# Patient Record
Sex: Female | Born: 1985 | Race: White | Hispanic: No | Marital: Single | Smoking: Never smoker
Health system: Southern US, Community
[De-identification: ages and names within clinical notes are randomized; demographics above are authoritative.]

## PROBLEM LIST (undated history)

## (undated) DIAGNOSIS — F419 Anxiety disorder, unspecified: Secondary | ICD-10-CM

## (undated) HISTORY — DX: Anxiety disorder, unspecified: F41.9

---
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
## 2006-01-02 ENCOUNTER — Inpatient Hospital Stay (HOSPITAL_COMMUNITY): Admission: AD | Admit: 2006-01-02 | Discharge: 2006-01-03 | Payer: Self-pay | Admitting: Obstetrics

## 2006-01-03 ENCOUNTER — Inpatient Hospital Stay (HOSPITAL_COMMUNITY): Admission: AD | Admit: 2006-01-03 | Discharge: 2006-01-05 | Payer: Self-pay | Admitting: Obstetrics & Gynecology

## 2006-07-24 ENCOUNTER — Emergency Department (HOSPITAL_COMMUNITY): Admission: EM | Admit: 2006-07-24 | Discharge: 2006-07-24 | Payer: Self-pay | Admitting: Emergency Medicine

## 2006-07-24 ENCOUNTER — Emergency Department (HOSPITAL_COMMUNITY): Admission: EM | Admit: 2006-07-24 | Discharge: 2006-07-24 | Payer: Self-pay | Admitting: Family Medicine

## 2006-10-10 ENCOUNTER — Emergency Department (HOSPITAL_COMMUNITY): Admission: EM | Admit: 2006-10-10 | Discharge: 2006-10-10 | Payer: Self-pay | Admitting: Emergency Medicine

## 2007-10-07 ENCOUNTER — Encounter: Admission: RE | Admit: 2007-10-07 | Discharge: 2007-10-07 | Payer: Self-pay | Admitting: Obstetrics & Gynecology

## 2007-10-22 ENCOUNTER — Ambulatory Visit (HOSPITAL_COMMUNITY): Admission: RE | Admit: 2007-10-22 | Discharge: 2007-10-22 | Payer: Self-pay | Admitting: Obstetrics & Gynecology

## 2007-10-25 IMAGING — US US OB COMP +14 WK
1 series · 13 of 28 positions shown · non-contrast
Comparison: none

CLINICAL DATA: Hypertension; assigned gestational age is 35 weeks 6 days.

[Series 1: us ob comp +14 wk · 0.31mm/px · 13 of 31 slices shown]
[im 2/31]
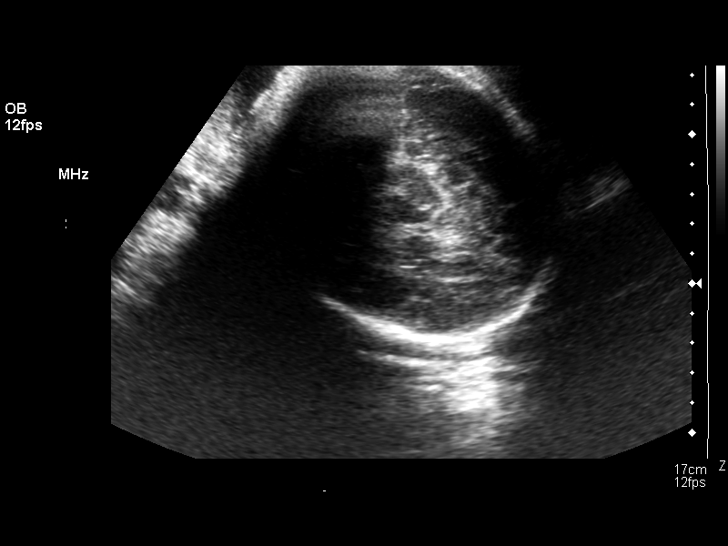
[im 4/31]
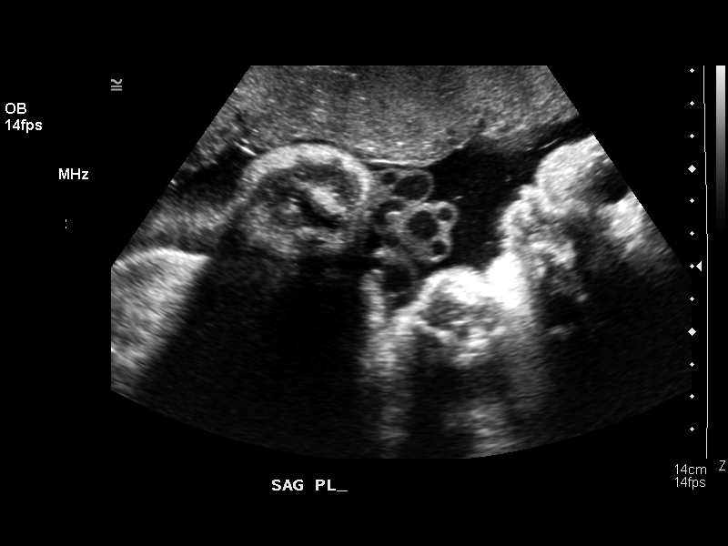
[im 6/31]
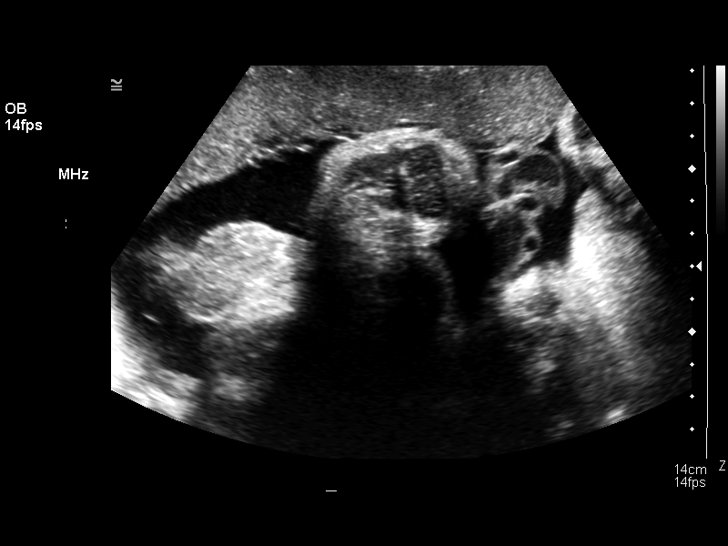
[im 8/31]
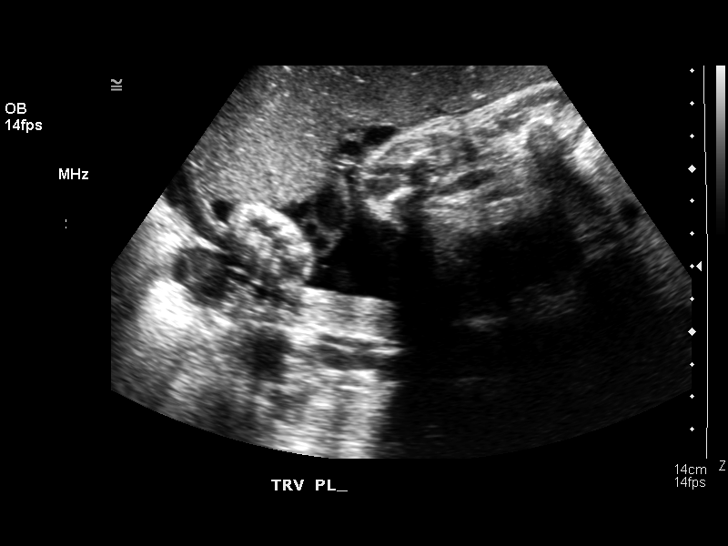
[im 11/31]
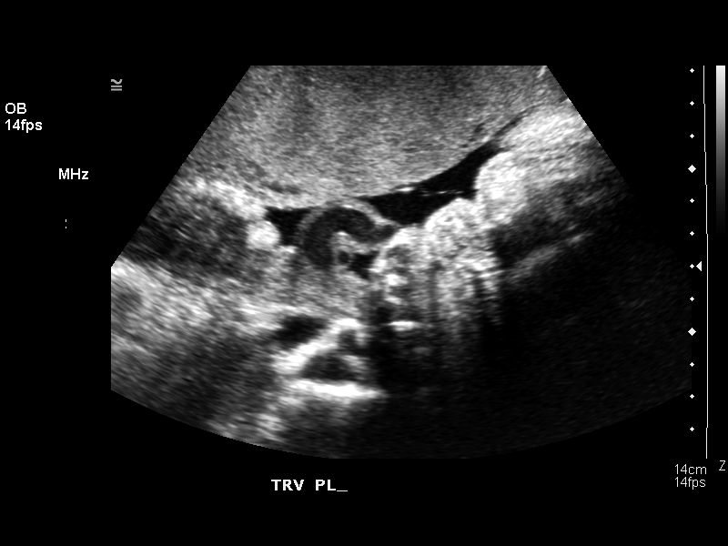
[im 13/31]
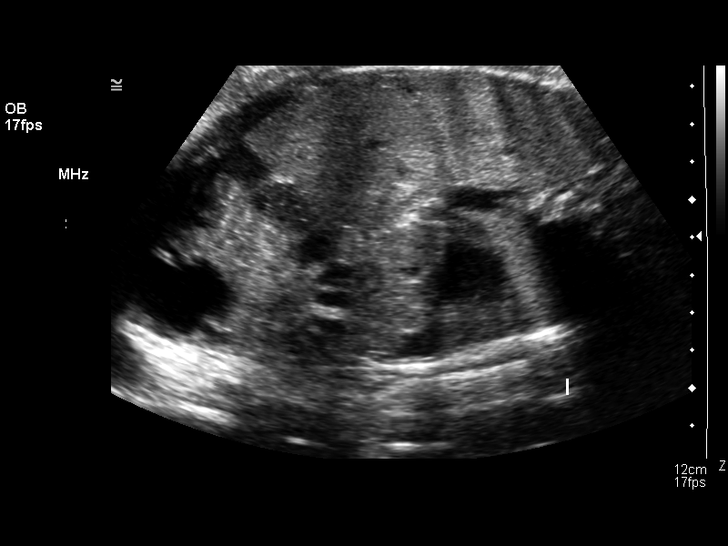
[im 16/31]
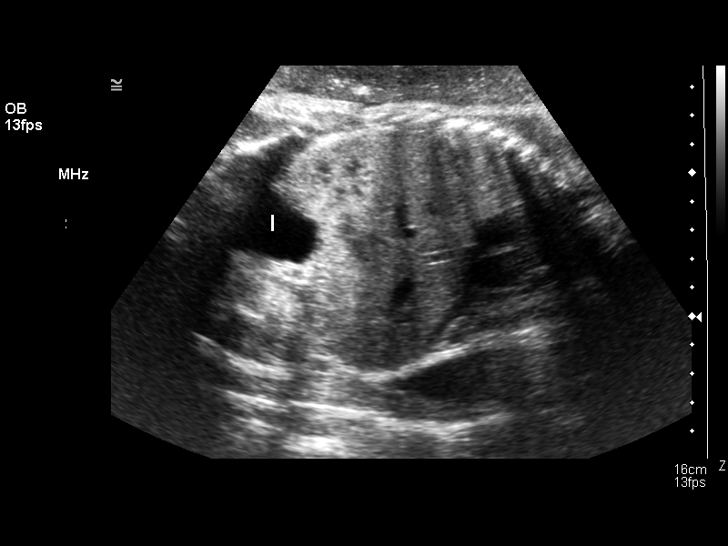
[im 18/31]
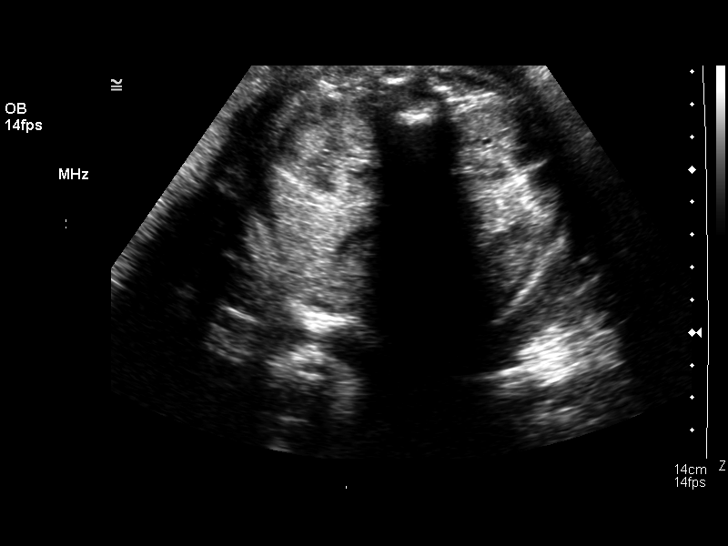
[im 21/31]
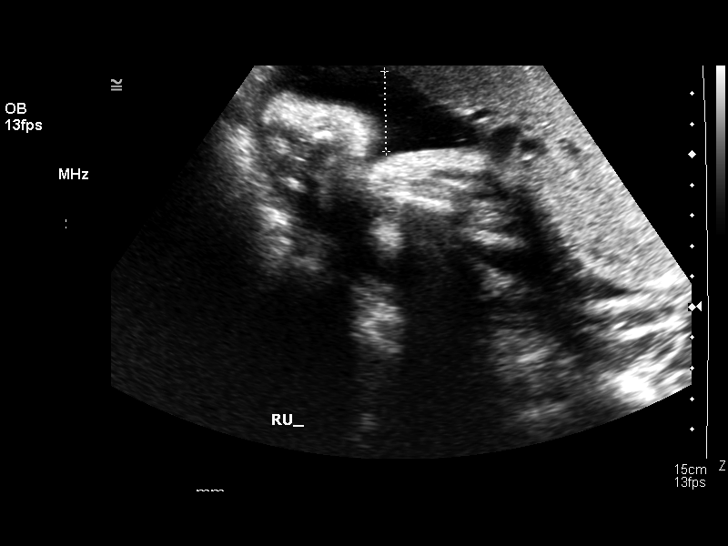
[im 23/31]
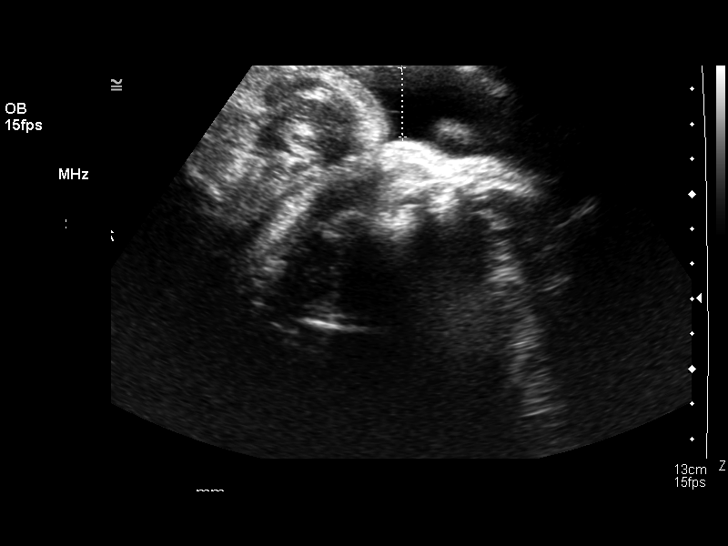
[im 25/31]
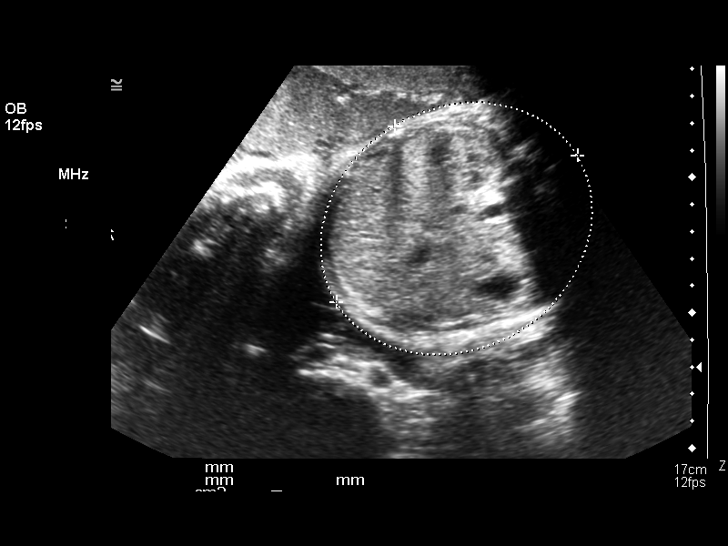
[im 27/31]
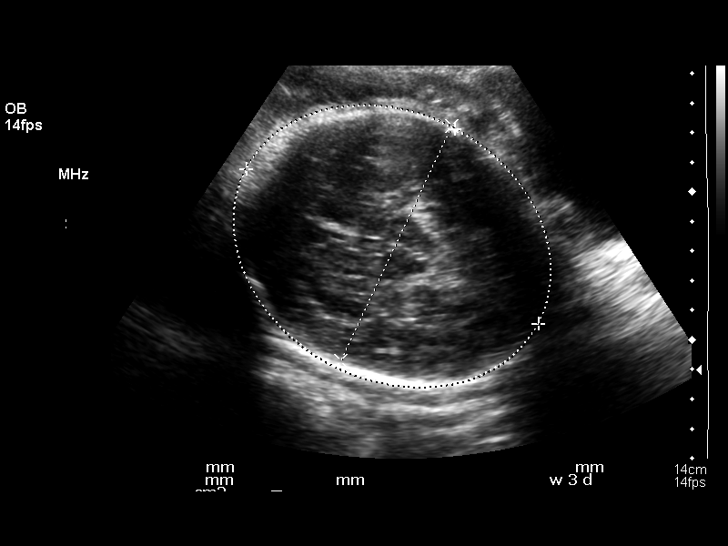
[im 29/31]
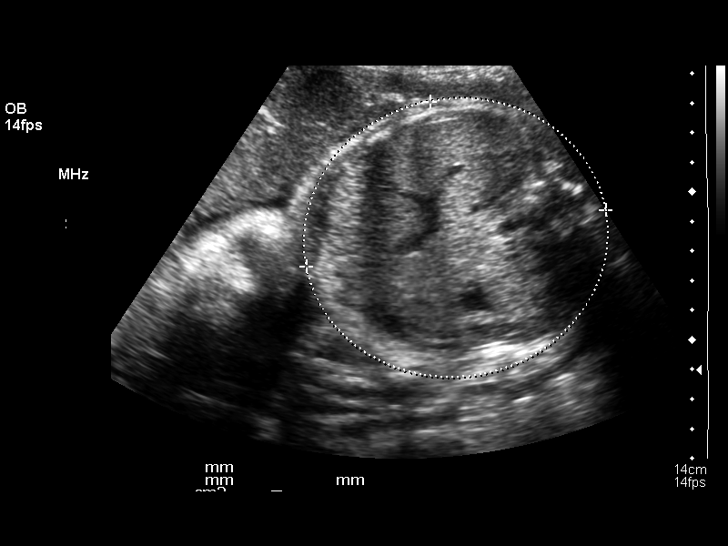

[13 of 28 positions shown; findings below may reference images not displayed]

OBSTETRICAL ULTRASOUND:
 Number of Fetuses: 1
 Heart Rate:  147
 Movement:  Yes
 Breathing:  No  
 Presentation:  Cephalic
 Placental Location:  Anterior
 Grade:  I
 Previa:  No
 Amniotic Fluid (Subjective):  Normal
 Amniotic Fluid (Objective):   10.9 cm AFI (5th -95th%ile = 7.7 ? 24.9 cm for 36 wks)

 FETAL BIOMETRY
 BPD:   8.7 cm  35 w 1 d
 HC:   31.7 cm   35 w 5 d
 AC:   30.8 cm   34 w 5 d
 FL:   6.8 cm   34 w 6 d

 MEAN GA:  35 w 1 d  US EDC:  01/16/06

 EFW: 1572 g (H) 25th ? 50th%ile (0147 ? 9499 g) For 36 wks 

 FETAL ANATOMY
 Lateral Ventricles:    Visualized 
 Thalami/CSP:      Visualized 
 Posterior Fossa:  Not visualized 
 Nuchal Region:    N/A
 Spine:      Not visualized 
 4 Chamber Heart on Left:      Visualized 
 Stomach on Left:      Not visualized 
 3 Vessel Cord:    Visualized 
 Cord Insertion site:    Not visualized   
 Kidneys:  Visualized 
 Bladder:  Visualized 
 Extremities:      Not visualized 

 Evaluation limited by: Advanced gestational age

 MATERNAL UTERINE AND ADNEXAL FINDINGS
 Cervix: Not evaluated > 34 wks
IMPRESSION: There is a single living intrauterine gestation in cephalic presentation.  The mean gestational age by today's ultrasound is 35 weeks 1 day which is concordant with the assigned gestational age.  The estimated fetal weight is at the 25th ? 50th percentile for a 36 week gestation.  The fetal indices are concordant.  The amniotic fluid volume is within normal limits with a total AFI of 10.9 cm.

## 2008-03-12 ENCOUNTER — Inpatient Hospital Stay (HOSPITAL_COMMUNITY): Admission: AD | Admit: 2008-03-12 | Discharge: 2008-03-14 | Payer: Self-pay | Admitting: Obstetrics & Gynecology

## 2009-09-02 IMAGING — US US OB COMP +14 WK
2 series · 14 of 28 positions shown · non-contrast
Comparison: none

OBSTETRICAL ULTRASOUND:

 This ultrasound exam was performed in the [HOSPITAL] Ultrasound Department.  The OB US report was generated in the AS system, and faxed to the ordering physician.  This report is also available in [REDACTED] PACS.

[Series 1: us ob comp +14 wk · 12 of 90 slices shown (1 of 2)]
[im 4/90]
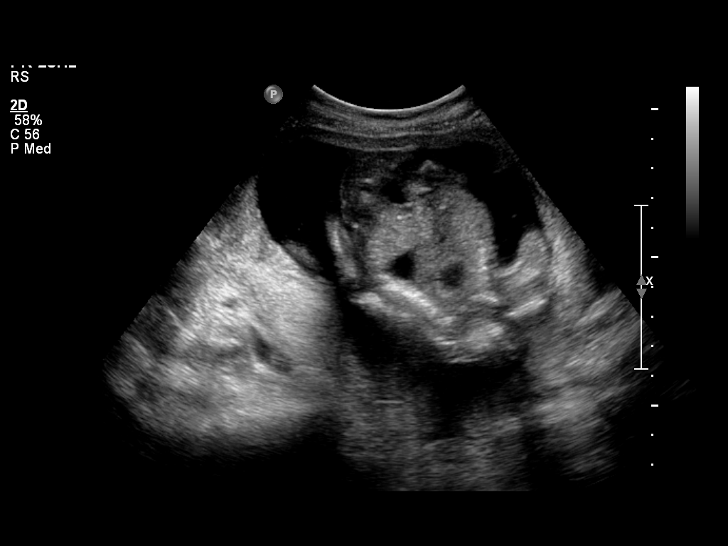
[im 12/90]
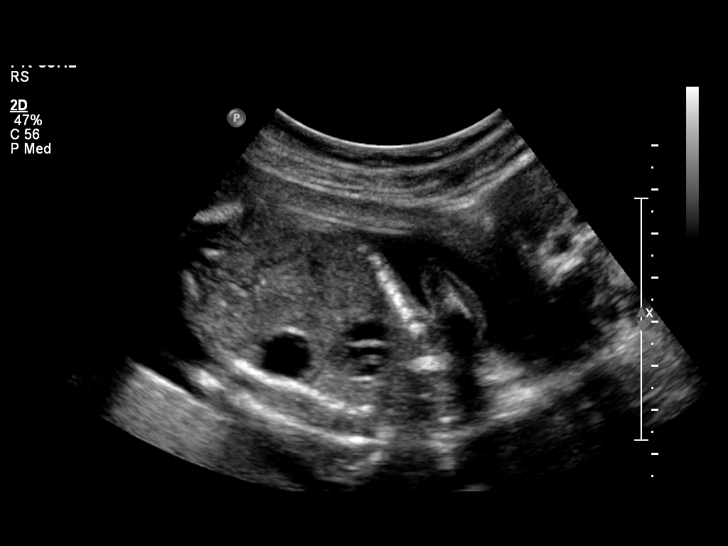
[im 19/90]
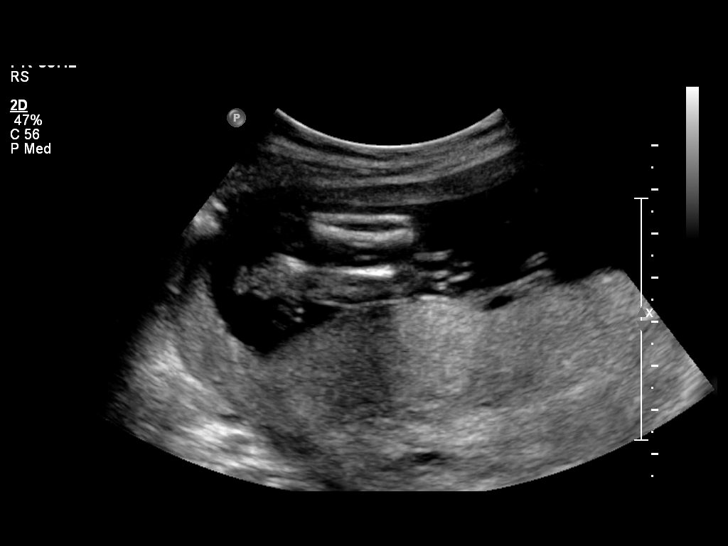
[im 26/90]
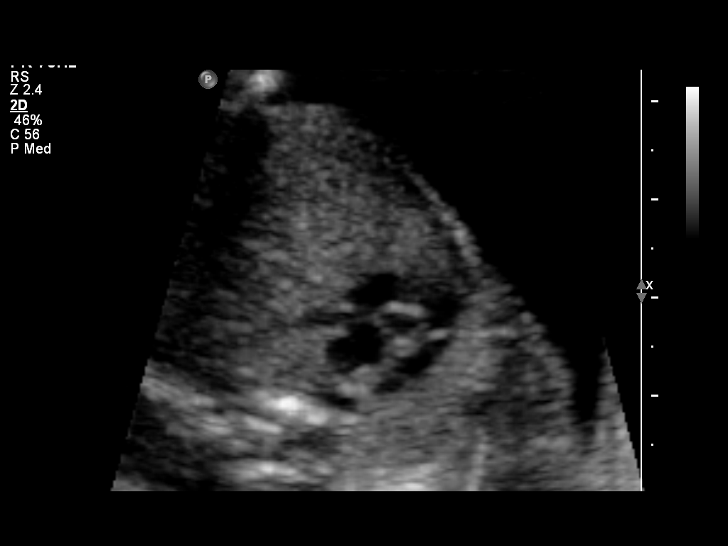
[im 34/90]
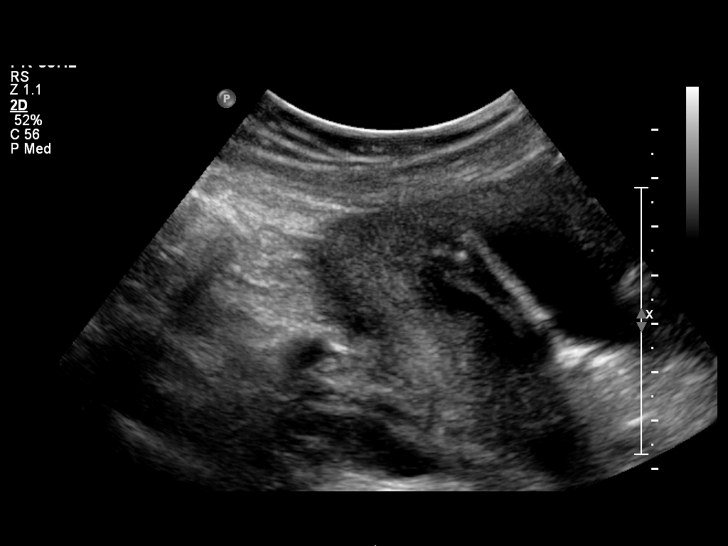
[im 41/90]
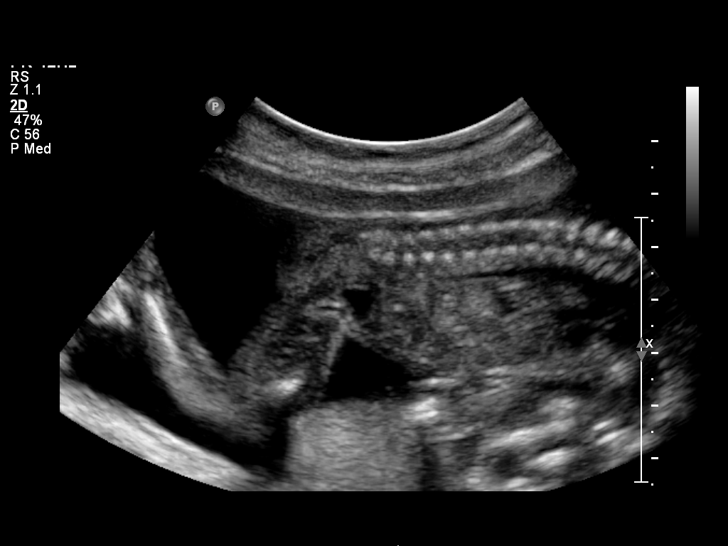
[im 49/90]
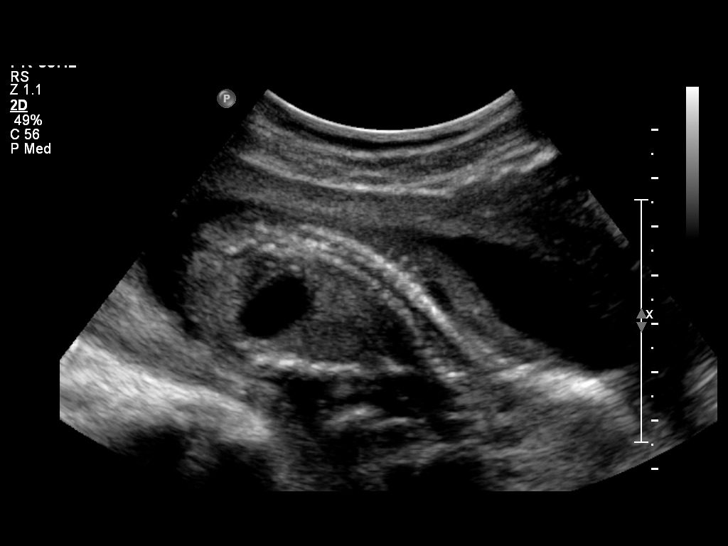
[im 56/90]
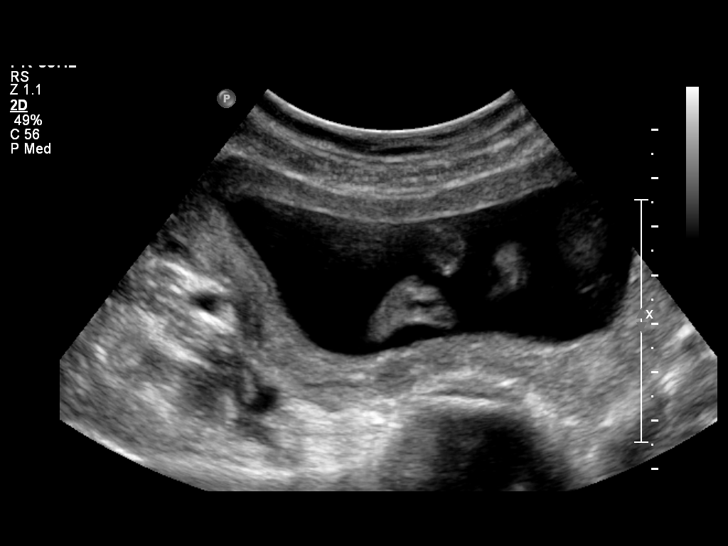
[im 64/90]
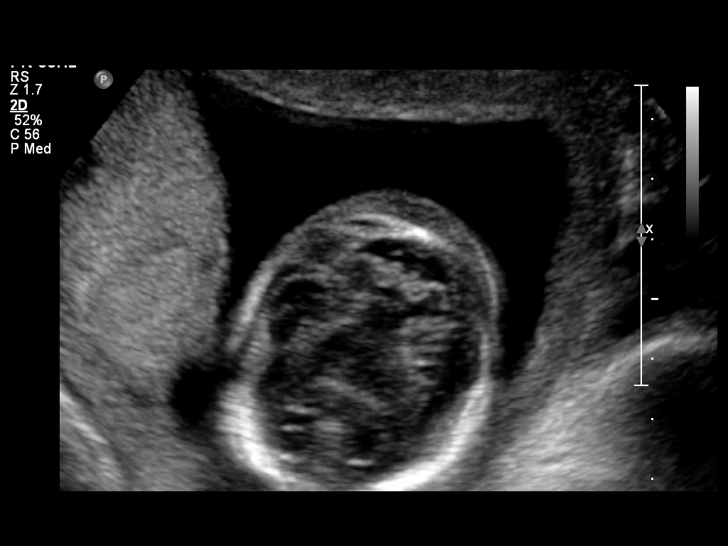
[im 71/90]
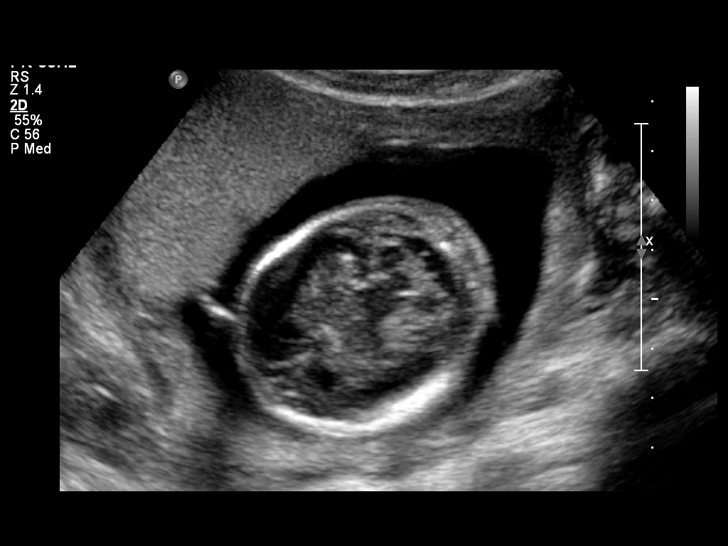
[im 78/90]
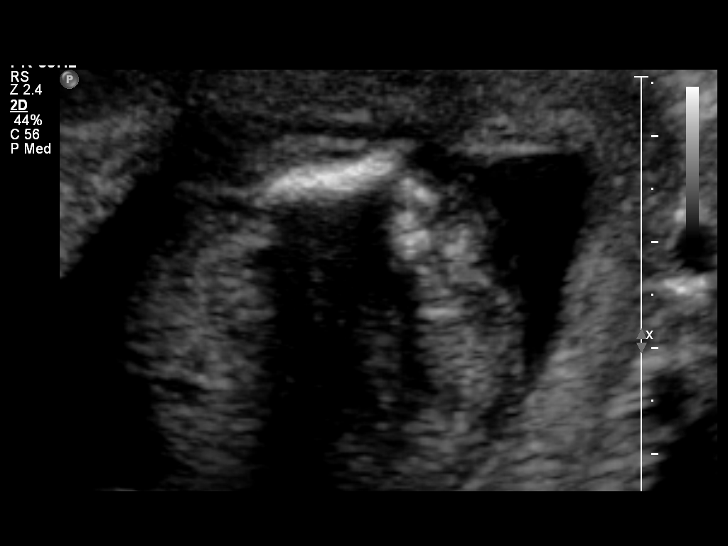
[im 86/90]
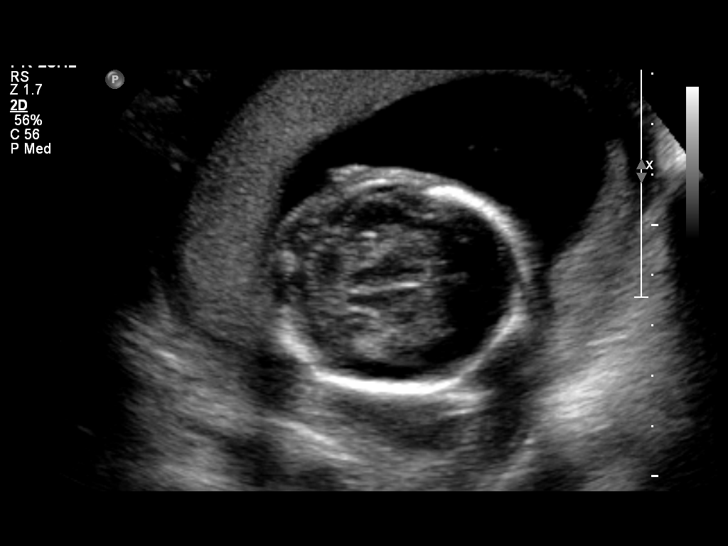

[Series 1: us ob comp +14 wk · 2 of 10 slices shown (2 of 2)]
[im 1/10]
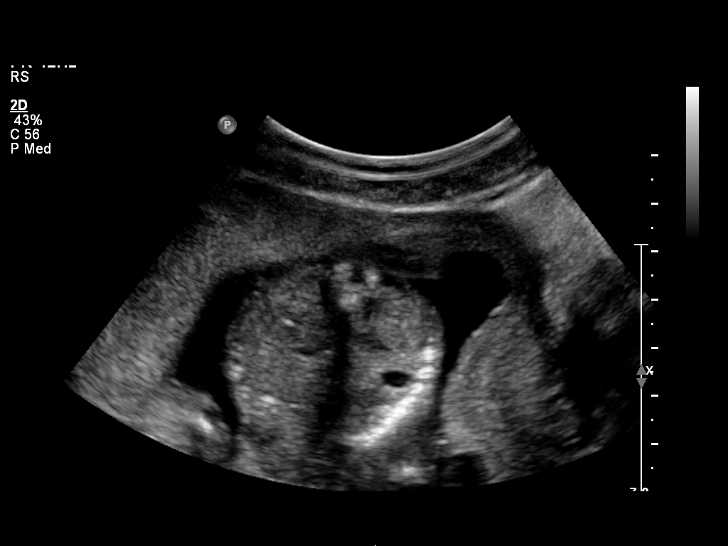
[im 10/10]
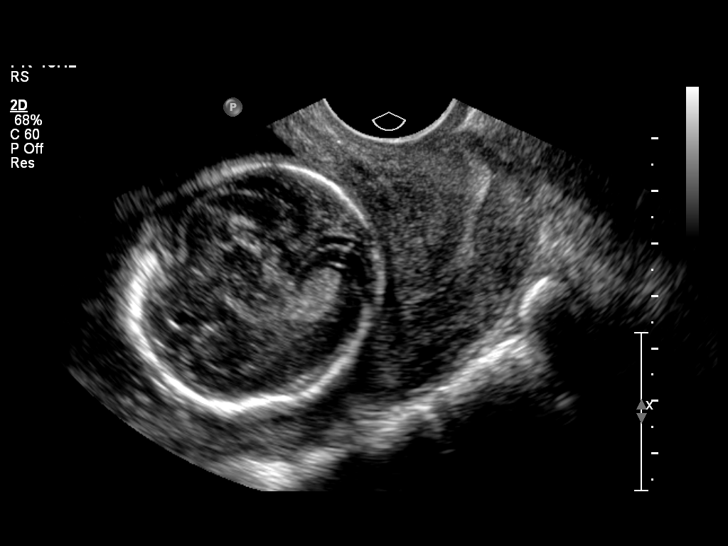

[14 of 28 positions shown; findings below may reference images not displayed]

IMPRESSION: See AS Obstetric US report.

## 2011-01-18 NOTE — H&P (Signed)
NAMEJONIECE, SMOTHERMAN           ACCOUNT NO.:  [id number]   MEDICAL RECORD NO.:  [id number]          PATIENT TYPE:  INP   LOCATION:  9160                          FACILITY:  [facility]   PHYSICIAN:  Roseanna Rainbow, M.D.DATE OF BIRTH:  May 01, 1986   DATE OF ADMISSION:  01/03/2006  DATE OF DISCHARGE:                                HISTORY & PHYSICAL   CHIEF COMPLAINT:  The patient is an 25 year old, gravida 1, para 0 with an  estimated date of confinement of Jan 11, 2006, with an intrauterine  pregnancy at 38+ weeks for induction of labor.   HISTORY OF PRESENT ILLNESS:  Please see the above.  The patient has had  elevated blood pressures throughout her antepartum course, so there is a  suspicion for possible chronic hypertension, possible superimposed pregnancy-  induced hypertension.  Baseline PIH panel in February was normal as well as  a baseline 24 hour urine for protein was normal as well.   ALLERGIES:  No known drug allergies.   MEDICATIONS:  Prenatal vitamins.   OB RISK FACTORS:  Please see the above.  She is an adolescent.   PRENATAL LABORATORY:  One hour Glucola 149 with a normal 3 hour GTT.  Blood  type O positive, antibody screen negative.  Hemoglobin 11.9, hematocrit  36.7, platelets 271,000.  Hepatitis B surface antigen negative, HIV  declined, RPR nonreactive, rubella immune.  An ultrasound on April 13 at 35  weeks 1 day, the estimated fetal weight percentile was 25th to the 50th  percentile.  The amniotic fluid was normal.   PAST OB/GYN HISTORY:  Noncontributory.   PAST MEDICAL HISTORY:  1.  Iron deficiency anemia.  2.  Questionable syncopal episodes of undetermined etiology.   PAST SURGICAL HISTORY:  No previous surgery.   SOCIAL HISTORY:  She is a Engineer, petroleum.  She is single, does not give  any significant history of alcohol use, no significant smoking history,  denies illicit drug use.   FAMILY HISTORY:  No major illnesses known.   PHYSICAL  EXAMINATION:  VITAL SIGNS:  Temperature 98.1, respiratory rate 20,  blood pressure 151/77, fetal heart tracing reassuring, tocodynamometer  monitor no uterine contractions.  STERILE VAGINAL EXAM:  Cervix is 3 and 100%.   ASSESSMENT:  Primigravida with an intrauterine pregnancy at term, rule out  chronic hypertension with superimposed pregnancy-induced hypertension.  No  neurological symptoms.  Fetal heart tracing consistent with fetal well-  being.   PLAN:  1.  Admission.  2.  Induction of labor, Pitocin, with subsequent AROM.  Will check a      pregnancy-induced hypertension panel.      Roseanna Rainbow, M.D.  Electronically Signed     LAJ/MEDQ  D:  01/03/2006  T:  01/03/2006  Job:  [id number]

## 2011-05-30 LAB — CBC
Hemoglobin: 12.5
MCV: 95.2
Platelets: 247
Platelets: 276
RDW: 12
RDW: 12.2
WBC: 8.5

## 2011-05-30 LAB — COMPREHENSIVE METABOLIC PANEL
ALT: 14
AST: 20
Albumin: 2.7 — ABNORMAL LOW
Alkaline Phosphatase: 125 — ABNORMAL HIGH
Chloride: 107
Creatinine, Ser: 0.35 — ABNORMAL LOW
GFR calc Af Amer: >60
GFR calc non Af Amer: >60
Potassium: 3.6
Total Bilirubin: 0.6

## 2011-05-30 LAB — RPR: RPR Ser Ql: NONREACTIVE

## 2011-09-27 ENCOUNTER — Ambulatory Visit: Payer: Self-pay | Admitting: Physical Therapy

## 2013-05-05 ENCOUNTER — Emergency Department (INDEPENDENT_AMBULATORY_CARE_PROVIDER_SITE_OTHER)
Admission: EM | Admit: 2013-05-05 | Discharge: 2013-05-05 | Disposition: A | Discharge status: Home / Self Care | Payer: Self-pay | Source: Home / Self Care

## 2013-05-05 ENCOUNTER — Encounter (HOSPITAL_COMMUNITY): Payer: Self-pay

## 2013-05-05 DIAGNOSIS — F419 Anxiety disorder, unspecified: Secondary | ICD-10-CM

## 2013-05-05 DIAGNOSIS — F411 Generalized anxiety disorder: Secondary | ICD-10-CM

## 2013-05-05 DIAGNOSIS — R531 Weakness: Secondary | ICD-10-CM

## 2013-05-05 DIAGNOSIS — R5381 Other malaise: Secondary | ICD-10-CM

## 2013-05-05 LAB — POCT I-STAT, CHEM 8
BUN: 8 mg/dL (ref 6–23)
Chloride: 103 mEq/L (ref 96–112)
Creatinine, Ser: 0.7 mg/dL (ref 0.50–1.10)
Glucose, Bld: 88 mg/dL (ref 70–99)
HCT: 42 % (ref 36.0–46.0)
Potassium: 3.7 mEq/L (ref 3.5–5.1)

## 2013-05-05 LAB — POCT URINALYSIS DIP (DEVICE)
Glucose, UA: NEGATIVE mg/dL
Hgb urine dipstick: NEGATIVE
Nitrite: NEGATIVE

## 2013-05-05 LAB — POCT PREGNANCY, URINE: Preg Test, Ur: NEGATIVE

## 2013-05-05 NOTE — ED Provider Notes (Signed)
CSN: [id number]     Arrival date & time 05/05/13  1020 History   First MD Initiated Contact with Patient 05/05/13 1124     Chief Complaint  Patient presents with  . Fatigue   (Consider location/radiation/quality/duration/timing/severity/associated sxs/prior Treatment) HPI Comments: 27 year old female states she is  in generally good health has had 5 days of weakness, fatigue, legs feeling like jelly when walking and decreased activity level. She also complains of palpitations and sense of fast heart rate when lying down quickly when alone. She mentions that she has heavy menses monthly period her menses are vital time and has not missed any. Denies pain anywhere. Denies problems with eating, drinking, GI symptoms, chest pain, shortness of breath, constitutional symptoms other than above.   History reviewed. No pertinent past medical history. History reviewed. No pertinent past surgical history. History reviewed. No pertinent family history. History  Substance Use Topics  . Smoking status: Never Smoker   . Smokeless tobacco: Not on file  . Alcohol Use: Not on file   OB History   Grav Para Term Preterm Abortions TAB SAB Ect Mult Living                 Review of Systems  Constitutional: Positive for activity change and fatigue. Negative for fever.  HENT: Negative.   Eyes: Negative.   Respiratory: Negative.   Cardiovascular: Positive for palpitations. Negative for chest pain.  Gastrointestinal: Negative.   Genitourinary: Negative.   Musculoskeletal: Negative.   Neurological: Positive for weakness and light-headedness. Negative for seizures, facial asymmetry and speech difficulty.  Psychiatric/Behavioral: The patient is nervous/anxious.     Allergies  Review of patient's allergies indicates not on file.  Home Medications  No current outpatient prescriptions on file. BP 117/72  Pulse 117  Temp(Src) 98.3 F (36.8 C) (Oral)  Resp 16  SpO2 100%  LMP 04/26/2013 Physical Exam   Nursing note and vitals reviewed. Constitutional: She is oriented to person, place, and time. She appears well-developed and well-nourished. No distress.  HENT:  Head: Normocephalic and atraumatic.  Mouth/Throat: Oropharynx is clear and moist. No oropharyngeal exudate.  Eyes: Conjunctivae and EOM are normal. Pupils are equal, round, and reactive to light.  Neck: Normal range of motion. Neck supple.  Cardiovascular: Normal rate and normal heart sounds.   Pulmonary/Chest: Effort normal and breath sounds normal. No respiratory distress. She has no wheezes. She has no rales.  Abdominal: Soft. There is no tenderness.  Musculoskeletal: Normal range of motion. She exhibits no edema and no tenderness.  Lymphadenopathy:    She has no cervical adenopathy.  Neurological: She is alert and oriented to person, place, and time. No cranial nerve deficit.  Skin: Skin is warm and dry.  Psychiatric: She has a normal mood and affect.    ED Course  Procedures (including critical care time) Labs Review Labs Reviewed  POCT URINALYSIS DIP (DEVICE) - Abnormal; Notable for the following:    Leukocytes, UA TRACE (*)    All other components within normal limits  POCT PREGNANCY, URINE  POCT I-STAT, CHEM 8   Results for orders placed during the hospital encounter of 05/05/13  POCT URINALYSIS DIP (DEVICE)      Result Value Range   Glucose, UA NEGATIVE  NEGATIVE mg/dL   Bilirubin Urine NEGATIVE  NEGATIVE   Ketones, ur NEGATIVE  NEGATIVE mg/dL   Specific Gravity, Urine 1.015  1.005 - 1.030   Hgb urine dipstick NEGATIVE  NEGATIVE   pH 7.5  5.0 -  8.0   Protein, ur NEGATIVE  NEGATIVE mg/dL   Urobilinogen, UA 1.0  0.0 - 1.0 mg/dL   Nitrite NEGATIVE  NEGATIVE   Leukocytes, UA TRACE (*) NEGATIVE  POCT PREGNANCY, URINE      Result Value Range   Preg Test, Ur NEGATIVE  NEGATIVE  POCT I-STAT, CHEM 8      Result Value Range   Sodium 140  135 - 145 mEq/L   Potassium 3.7  3.5 - 5.1 mEq/L   Chloride 103  96 -  112 mEq/L   BUN 8  6 - 23 mg/dL   Creatinine, Ser 4.54  0.50 - 1.10 mg/dL   Glucose, Bld 88  70 - 99 mg/dL   Calcium, Ion 0.98  1.19 - 1.23 mmol/L   TCO2 25  0 - 100 mmol/L   Hemoglobin 14.3  12.0 - 15.0 g/dL   HCT 14.7  82.9 - 56.2 %    Imaging Review No results found.  MDM   1. Anxiety   2. Weakness generalized      Physical exam and laboratory values are all within normal limits. She has no focal findings or particular complaints. She remains anxious. I suspect that her overall symptoms are due to anxiety. She needs to followup with her PCP. Other tests may include thyroid studies, cardiac monitoring and other lab work. She is discharged in stable condition. EKG normal sinus rhythm with borderline tachycardia.     Hayden Rasmussen, NP 05/05/13 1325

## 2013-05-05 NOTE — ED Notes (Addendum)
C/o has not felt well past since 8-29 , tires easily, feels like she will pass out, no specific area of pain; c/o she has been crying more than usual, sore throat

## 2013-05-05 NOTE — ED Provider Notes (Signed)
Medical screening examination/treatment/procedure(s) were performed by non-physician practitioner and as supervising physician I was immediately available for consultation/collaboration.   [facility]; MD  Sharin Grave, MD 05/05/13 1415

## 2013-06-27 ENCOUNTER — Emergency Department (HOSPITAL_COMMUNITY)
Admission: EM | Admit: 2013-06-27 | Discharge: 2013-06-27 | Discharge status: Absent without leave | Payer: Self-pay | Source: Home / Self Care

## 2013-10-06 ENCOUNTER — Emergency Department (HOSPITAL_COMMUNITY)
Admission: EM | Admit: 2013-10-06 | Discharge: 2013-10-07 | Disposition: A | Discharge status: Home / Self Care | Payer: No Typology Code available for payment source | Attending: Emergency Medicine | Admitting: Emergency Medicine

## 2013-10-06 ENCOUNTER — Emergency Department (HOSPITAL_COMMUNITY): Payer: No Typology Code available for payment source

## 2013-10-06 ENCOUNTER — Encounter (HOSPITAL_COMMUNITY): Payer: Self-pay | Admitting: Emergency Medicine

## 2013-10-06 DIAGNOSIS — Z79899 Other long term (current) drug therapy: Secondary | ICD-10-CM | POA: Insufficient documentation

## 2013-10-06 DIAGNOSIS — Z3202 Encounter for pregnancy test, result negative: Secondary | ICD-10-CM | POA: Insufficient documentation

## 2013-10-06 DIAGNOSIS — R221 Localized swelling, mass and lump, neck: Secondary | ICD-10-CM

## 2013-10-06 DIAGNOSIS — R220 Localized swelling, mass and lump, head: Secondary | ICD-10-CM | POA: Insufficient documentation

## 2013-10-06 DIAGNOSIS — R000 Tachycardia, unspecified: Secondary | ICD-10-CM | POA: Insufficient documentation

## 2013-10-06 DIAGNOSIS — F411 Generalized anxiety disorder: Secondary | ICD-10-CM | POA: Insufficient documentation

## 2013-10-06 DIAGNOSIS — R002 Palpitations: Secondary | ICD-10-CM | POA: Insufficient documentation

## 2013-10-06 DIAGNOSIS — R0602 Shortness of breath: Secondary | ICD-10-CM | POA: Insufficient documentation

## 2013-10-06 DIAGNOSIS — F419 Anxiety disorder, unspecified: Secondary | ICD-10-CM

## 2013-10-06 LAB — POCT I-STAT, CHEM 8
BUN: 13 mg/dL (ref 6–23)
CREATININE: 0.6 mg/dL (ref 0.50–1.10)
Calcium, Ion: 1.22 mmol/L (ref 1.12–1.23)
Chloride: 105 mEq/L (ref 96–112)
Glucose, Bld: 85 mg/dL (ref 70–99)
HEMATOCRIT: 42 % (ref 36.0–46.0)
Hemoglobin: 14.3 g/dL (ref 12.0–15.0)
POTASSIUM: 3.2 meq/L — AB (ref 3.7–5.3)
Sodium: 141 mEq/L (ref 137–147)
TCO2: 21 mmol/L (ref 0–100)

## 2013-10-06 LAB — CBC WITH DIFFERENTIAL/PLATELET
BASOS ABS: 0 10*3/uL (ref 0.0–0.1)
BASOS PCT: 0 % (ref 0–1)
Eosinophils Absolute: 0.1 10*3/uL (ref 0.0–0.7)
Eosinophils Relative: 1 % (ref 0–5)
HCT: 37.6 % (ref 36.0–46.0)
HEMOGLOBIN: 12.9 g/dL (ref 12.0–15.0)
Lymphocytes Relative: 39 % (ref 12–46)
Lymphs Abs: 3.2 10*3/uL (ref 0.7–4.0)
MCH: 30.7 pg (ref 26.0–34.0)
MCHC: 34.3 g/dL (ref 30.0–36.0)
MCV: 89.5 fL (ref 78.0–100.0)
Monocytes Absolute: 0.7 10*3/uL (ref 0.1–1.0)
Monocytes Relative: 9 % (ref 3–12)
NEUTROS ABS: 4.3 10*3/uL (ref 1.7–7.7)
NEUTROS PCT: 52 % (ref 43–77)
Platelets: 275 10*3/uL (ref 150–400)
RBC: 4.2 MIL/uL (ref 3.87–5.11)
RDW: 11.7 % (ref 11.5–15.5)
WBC: 8.3 10*3/uL (ref 4.0–10.5)

## 2013-10-06 MED ORDER — SODIUM CHLORIDE 0.9 % IV BOLUS (SEPSIS)
1000.0000 mL | Freq: Once | INTRAVENOUS | Status: AC
  Administered 2013-10-06: 1000 mL via INTRAVENOUS

## 2013-10-06 MED ORDER — POTASSIUM CHLORIDE 20 MEQ/15ML (10%) PO LIQD
20.0000 meq | Freq: Once | ORAL | Status: AC
  Administered 2013-10-06: 20 meq via ORAL
  Filled 2013-10-06: qty 15

## 2013-10-06 MED ORDER — DIPHENHYDRAMINE HCL 50 MG/ML IJ SOLN
12.5000 mg | Freq: Once | INTRAMUSCULAR | Status: AC
  Administered 2013-10-06: 12.5 mg via INTRAVENOUS
  Filled 2013-10-06: qty 1

## 2013-10-06 MED ORDER — LORAZEPAM 2 MG/ML IJ SOLN
0.5000 mg | Freq: Once | INTRAMUSCULAR | Status: AC
  Administered 2013-10-06: 0.5 mg via INTRAVENOUS
  Filled 2013-10-06: qty 1

## 2013-10-06 MED ORDER — POTASSIUM CHLORIDE CRYS ER 20 MEQ PO TBCR
40.0000 meq | EXTENDED_RELEASE_TABLET | Freq: Once | ORAL | Status: AC
  Administered 2013-10-06: 20 meq via ORAL
  Filled 2013-10-06: qty 2

## 2013-10-06 NOTE — ED Notes (Signed)
Pt reports that she feels like her throat is swelling.  Pt is able to speak in full sentences without difficulty.

## 2013-10-06 NOTE — ED Notes (Signed)
Pt states she is feeling short of breath like she cant breathe and feeling like it is hard to swallow  Pt states it happened earlier today about 130 and lasted for about an hour  Pt states it started again 2130  Pt states she had been bowling and it started afterward on her way home  Pt states earlier today it happened while she was just sitting on the couch

## 2013-10-06 NOTE — ED Notes (Signed)
Pt at X-Ray

## 2013-10-06 NOTE — ED Notes (Signed)
Notified RN, Merle and EDP, Walden,MD. Pt. i-stat Chem 8 results potassium 3.2.

## 2013-10-07 LAB — POCT PREGNANCY, URINE: PREG TEST UR: NEGATIVE

## 2013-10-07 LAB — URINALYSIS W MICROSCOPIC + REFLEX CULTURE
BILIRUBIN URINE: NEGATIVE
Glucose, UA: NEGATIVE mg/dL
Hgb urine dipstick: NEGATIVE
Ketones, ur: NEGATIVE mg/dL
LEUKOCYTES UA: NEGATIVE
NITRITE: NEGATIVE
PH: 7.5 (ref 5.0–8.0)
Protein, ur: NEGATIVE mg/dL
SPECIFIC GRAVITY, URINE: 1.007 (ref 1.005–1.030)
UROBILINOGEN UA: 0.2 mg/dL (ref 0.0–1.0)

## 2013-10-07 LAB — D-DIMER, QUANTITATIVE (NOT AT ARMC): D DIMER QUANT: 0.31 ug{FEU}/mL (ref 0.00–0.48)

## 2013-10-07 MED ORDER — LORAZEPAM 1 MG PO TABS: 1.0000 mg | ORAL_TABLET | Freq: Three times a day (TID) | ORAL | Status: DC | PRN

## 2013-10-07 NOTE — Discharge Instructions (Signed)
Continue taking Benadryl at home for possible allergic reaction. Take Ativan as prescribed as needed for anxiety. Please followup as soon as you're able with her primary care Dr. Return if your symptoms are worsening. Your x-ray lab work today did not show any abnormalities.   Panic Attacks Panic attacks are sudden, short-livedsurges of severe anxiety, fear, or discomfort. They may occur for no reason when you are relaxed, when you are anxious, or when you are sleeping. Panic attacks may occur for a number of reasons:   Healthy people occasionally have panic attacks in extreme, life-threatening situations, such as war or natural disasters. Normal anxiety is a protective mechanism of the body that helps us react to danger (fight or flight response).  Panic attacks are often seen with anxiety disorders, such as panic disorder, social anxiety disorder, generalized anxiety disorder, and phobias. Anxiety disorders cause excessive or uncontrollable anxiety. They may interfere with your relationships or other life activities.  Panic attacks are sometimes seen with other mental illnesses such as depression and posttraumatic stress disorder.  Certain medical conditions, prescription medicines, and drugs of abuse can cause panic attacks. SYMPTOMS  Panic attacks start suddenly, peak within 20 minutes, and are accompanied by four or more of the following symptoms:  Pounding heart or fast heart rate (palpitations).  Sweating.  Trembling or shaking.  Shortness of breath or feeling smothered.  Feeling choked.  Chest pain or discomfort.  Nausea or strange feeling in your stomach.  Dizziness, lightheadedness, or feeling like you will faint.  Chills or hot flushes.  Numbness or tingling in your lips or hands and feet.  Feeling that things are not real or feeling that you are not yourself.  Fear of losing control or going crazy.  Fear of dying. Some of these symptoms can mimic serious medical  conditions. For example, you may think you are having a heart attack. Although panic attacks can be very scary, they are not life threatening. DIAGNOSIS  Panic attacks are diagnosed through an assessment by your health care provider. Your health care provider will ask questions about your symptoms, such as where and when they occurred. Your health care provider will also ask about your medical history and use of alcohol and drugs, including prescription medicines. Your health care provider may order blood tests or other studies to rule out a serious medical condition. Your health care provider may refer you to a mental health professional for further evaluation. TREATMENT   Most healthy people who have one or two panic attacks in an extreme, life-threatening situation will not require treatment.  The treatment for panic attacks associated with anxiety disorders or other mental illness typically involves counseling with a mental health professional, medicine, or a combination of both. Your health care provider will help determine what treatment is best for you.  Panic attacks due to physical illness usually goes away with treatment of the illness. If prescription medicine is causing panic attacks, talk with your health care provider about stopping the medicine, decreasing the dose, or substituting another medicine.  Panic attacks due to alcohol or drug abuse goes away with abstinence. Some adults need professional help in order to stop drinking or using drugs. HOME CARE INSTRUCTIONS   Take all your medicines as prescribed.   Check with your health care provider before starting new prescription or over-the-counter medicines.  Keep all follow up appointments with your health care provider. SEEK MEDICAL CARE IF:  You are not able to take your medicines as prescribed.  Your symptoms do not improve or get worse. SEEK IMMEDIATE MEDICAL CARE IF:   You experience panic attack symptoms that are  different than your usual symptoms.  You have serious thoughts about hurting yourself or others.  You are taking medicine for panic attacks and have a serious side effect. MAKE SURE YOU:  Understand these instructions.  Will watch your condition.  Will get help right away if you are not doing well or get worse. Document Released: 08/19/2005 Document Revised: 06/09/2013 Document Reviewed: 04/02/2013 [facility] Patient Information 2014 [place], [place].

## 2013-10-07 NOTE — ED Provider Notes (Signed)
CSN: [id number]     Arrival date & time 10/06/13  2148 History   First MD Initiated Contact with Patient 10/06/13 2237     Chief Complaint  Patient presents with  . Shortness of Breath   (Consider location/radiation/quality/duration/timing/severity/associated sxs/prior Treatment) HPI Molly Allen is a 28 y.o. female who presents emergency department complaining of shortness of breath. Patient states that she has felt some shortness of breath and possible difficulty swallowing earlier this morning. She states her symptoms improved the afternoon. She states she's taking several naps since then and when woke up again at 9:30 felt like her stress of breath was getting worse and she felt like her throat was swelling. She states she didn't take any medications, and she states that she started to panic and her husband brought her to emergency department. Patient denies any chest pain. She still feels like she is short of breath but describes it as a tightness in her throat. She denies any rash, itching, swelling to the lips or ears. She denies any pain in her throat. Patient is crying and appears very anxious.  History reviewed. No pertinent past medical history. History reviewed. No pertinent past surgical history. Family History  Problem Relation Age of Onset  . Cancer Other    History  Substance Use Topics  . Smoking status: Never Smoker   . Smokeless tobacco: Not on file  . Alcohol Use: No   OB History   Grav Para Term Preterm Abortions TAB SAB Ect Mult Living                 Review of Systems  Constitutional: Negative for fever and chills.  HENT: Positive for trouble swallowing. Negative for congestion, facial swelling and sore throat.   Respiratory: Positive for shortness of breath. Negative for cough and chest tightness.   Cardiovascular: Positive for palpitations. Negative for chest pain and leg swelling.  Gastrointestinal: Negative for nausea, vomiting, abdominal pain and  diarrhea.  Genitourinary: Negative for dysuria and flank pain.  Musculoskeletal: Negative for arthralgias, myalgias, neck pain and neck stiffness.  Skin: Negative for rash.  Neurological: Negative for dizziness, weakness and headaches.  Psychiatric/Behavioral: The patient is nervous/anxious.   All other systems reviewed and are negative.    Allergies  Review of patient's allergies indicates no known allergies.  Home Medications   Current Outpatient Rx  Name  Route  Sig  Dispense  Refill  . Acetaminophen-Caff-Pyrilamine (MIDOL COMPLETE PO)   Oral   Take 1 tablet by mouth every 8 (eight) hours as needed (pain).         . Multiple Vitamin (MULTIVITAMIN WITH MINERALS) TABS tablet   Oral   Take 1 tablet by mouth daily.          BP 137/102  Pulse 103  Temp(Src) 97.4 F (36.3 C) (Oral)  Resp 17  SpO2 97%  LMP 09/24/2013 Physical Exam  Nursing note and vitals reviewed. Constitutional: She is oriented to person, place, and time. She appears well-developed and well-nourished.  Patient is anxious, tearful  HENT:  Head: Normocephalic.  Oropharynx is normal, uvula is midline, no tonsillar or uvular swelling. No trismus. Tongue is normal. Lips are normal. No facial swelling.  Eyes: Conjunctivae are normal. Pupils are equal, round, and reactive to light.  Neck: Normal range of motion. Neck supple.  Thyroid is normal size, no nodules. Trachea is midline.  Cardiovascular: Regular rhythm and normal heart sounds.   Tachycardic  Pulmonary/Chest: Effort normal and breath sounds  normal. No respiratory distress. She has no wheezes. She has no rales.  Abdominal: Soft. Bowel sounds are normal. She exhibits no distension. There is no tenderness. There is no rebound.  Musculoskeletal: She exhibits no edema.  Neurological: She is alert and oriented to person, place, and time.  Skin: Skin is warm and dry.  Psychiatric:   anxious    ED Course  Procedures (including critical care  time) Labs Review Labs Reviewed  POCT I-STAT, CHEM 8 - Abnormal; Notable for the following:    Potassium 3.2 (*)    All other components within normal limits  CBC WITH DIFFERENTIAL  URINALYSIS W MICROSCOPIC + REFLEX CULTURE  D-DIMER, QUANTITATIVE  POCT PREGNANCY, URINE   Imaging Review Dg Neck Soft Tissue  10/07/2013   CLINICAL DATA:  Difficulty swallowing.  EXAM: NECK SOFT TISSUES - 1+ VIEW  COMPARISON:  None.  FINDINGS: There is no evidence of retropharyngeal soft tissue swelling or epiglottic enlargement. The cervical airway is unremarkable and no radio-opaque foreign body identified.  IMPRESSION: Negative exam.   Electronically Signed   By: Drusilla Kanner M.D.   On: 10/07/2013 00:01    EKG Interpretation   None       MDM   1. Shortness of breath   2. Throat swelling   3. Anxiety    Patient initially is very anxious, crying in the room, keeps saying that her throat feels like it is tight and she's having difficulty swallowing. Oropharynx appears normal, no facial, lip, uvula swelling. There is no stridor on exam. Ordered fluids, Benadryl, Ativan. Labs are ordered as well and soft tissue neck. EKG showing sinus tachycardia. Patient's initial heart rate is 156. Will recheck.   1:02 AM Patient's lab work is all unremarkable including d-dimer which is negative. Patient's feels better after Ativan and Benadryl. She is resting comfortably. Her heart rate is in 90s on the monitor. She received 1 L of normal saline. She is no longer crying. She states she feels much better. She still concerned about what could be the cause of her symptoms. I do suspect it is most likely anxiety however explained to her she will need close followup with her Dr. and will continue Benadryl at home. Possible anaphylaxis and allergic reaction was considered however I doubt given no exam findings to support it. I will discharge patient home in stable condition, will prescribe few Ativan to help her with anxiety  until she is able to get in with her Dr. Also will continue Benadryl for the next 3 days. Pt is non toxic appearing. Drinking in ED  Filed Vitals:   10/06/13 2150 10/06/13 2245 10/06/13 2245 10/07/13 0015  BP: 137/102     Pulse: 156 98  103  Temp:   97.4 F (36.3 C)   TempSrc:   Oral   Resp: 24 16  17   SpO2: 100% 98%  97%     Amer Alcindor A Jeffery Gammell, PA-C 10/07/13 0106

## 2013-10-08 NOTE — ED Provider Notes (Signed)
Medical screening examination/treatment/procedure(s) were performed by non-physician practitioner and as supervising physician I was immediately available for consultation/collaboration.  EKG Interpretation   None        Rafay Dahan, MD 10/08/13 1129 

## 2013-10-12 ENCOUNTER — Ambulatory Visit (INDEPENDENT_AMBULATORY_CARE_PROVIDER_SITE_OTHER): Payer: No Typology Code available for payment source | Admitting: Internal Medicine

## 2013-10-12 VITALS — BP 126/78 | HR 97 | Temp 98.6°F | Resp 17 | Ht 64.5 in | Wt 102.0 lb

## 2013-10-12 DIAGNOSIS — E876 Hypokalemia: Secondary | ICD-10-CM

## 2013-10-12 DIAGNOSIS — K219 Gastro-esophageal reflux disease without esophagitis: Secondary | ICD-10-CM

## 2013-10-12 DIAGNOSIS — F411 Generalized anxiety disorder: Secondary | ICD-10-CM

## 2013-10-12 DIAGNOSIS — R1310 Dysphagia, unspecified: Secondary | ICD-10-CM

## 2013-10-12 MED ORDER — PAROXETINE HCL 10 MG PO TABS: 10.0000 mg | ORAL_TABLET | Freq: Every day | ORAL | Status: DC

## 2013-10-12 NOTE — Progress Notes (Signed)
Subjective:    Patient ID: Molly Allen, female    DOB: [birth year], 28 y.o.   MRN: [id number] This chart was scribed for Ellamae Sia, MD  by Danella Maiers, ED Scribe. This patient was seen in room 5 and the patient's care was started at 8:03 PM.  Chief Complaint  Patient presents with  . Panic Attack  . Anxiety    HPI HPI Comments: JUDEE Allen is a 28 y.o. female with a h/o anxiety who presents to the [facility] complaining of chronic anxiety that has worsened in the past month. She reports 2 episodes of panic attacks 6 days ago, before and after going bowling with friends. She describes the panic attacks as feeling like she cannot swallow, followed by tremors, diaphoresis, and palpitations lasting for one hour. She reports feeling anxious, tired, and weak since the panic attacks. She reports being home alone makes it worse. She reports night sweats. She states she quit her job 2 weeks ago because of her anxiety.  She states she has been anxious since she was a child. There were problems with her parents and she exhibited acting out behavior   She reports 3 episodes of acid reflux recently. A few days ago she had diarrhea and reports frequently feeling like she needs to burp. Her LMP was 1/22. She has heavy periods. She denies trouble falling or staying asleep.    She is on no current medications and has no known illnesses Mother of 2 , married   No Known Allergies   Review of Systems  Constitutional: Negative for fever, appetite change and unexpected weight change.  Musculoskeletal: Negative for arthralgias.  Skin: Negative for rash.   GU negative Neuro negative     Objective:   Physical Exam  Nursing note and vitals reviewed. Constitutional: She is oriented to person, place, and time. She appears well-developed and well-nourished. No distress.  HENT:  Head: Normocephalic and atraumatic.  Eyes: EOM are normal. Pupils are equal,  round, and reactive to light.  Neck: Neck supple. No thyromegaly present.  Cardiovascular: Normal rate, regular rhythm and normal heart sounds.   No murmur heard. Pulmonary/Chest: Effort normal and breath sounds normal. No respiratory distress.  Musculoskeletal: Normal range of motion.  Lymphadenopathy:    She has no cervical adenopathy.  Neurological: She is alert and oriented to person, place, and time.  Skin: Skin is warm and dry.  Psychiatric: She has a normal mood and affect. Her behavior is normal.     Filed Vitals:   10/12/13 1924  BP: 126/78  Pulse: 97  Temp: 98.6 F (37 C)  TempSrc: Oral  Resp: 17  Height: 5' 4.5" (1.638 m)  Weight: 102 lb (46.267 kg)  SpO2: 98%         Assessment & Plan:  Generalized anxiety disorder - Plan: Comprehensive metabolic panel, TSH, T4, free  Dysphagia - Plan: Comprehensive metabolic panel, TSH, T4, free  GERD (gastroesophageal reflux disease) - Plan: Comprehensive metabolic panel, TSH, T4, free  Hypokalemia - Plan: Comprehensive metabolic panel, TSH, T4, free  Meds ordered this encounter  Medications  . PARoxetine (PAXIL) 10 MG tablet    Sig: Take 1 tablet (10 mg total) by mouth daily. May increase to 2 daily in 10 days    Dispense:  60 tablet    Refill:  1   Followup in 4 weeks Was given Ativan in the emergency room and may use 1/4-1/2 tablet for panic attack if  desired Control of GERD may be important  I have completed the patient encounter in its entirety as documented by the scribe, with editing by me where necessary. Jeremian Whitby P. Merla Richesoolittle, M.D.   Results for orders placed in visit on 10/12/13  COMPREHENSIVE METABOLIC PANEL      Result Value Ref Range   Sodium 139  135 - 145 mEq/L   Potassium 4.3  3.5 - 5.3 mEq/L   Chloride 106  96 - 112 mEq/L   CO2 26  19 - 32 mEq/L   Glucose, Bld 80  70 - 99 mg/dL   BUN 14  6 - 23 mg/dL   Creat 4.090.60  8.110.50 - 9.141.10 mg/dL   Total Bilirubin 0.4  0.2 - 1.2 mg/dL   Alkaline Phosphatase  38 (*) 39 - 117 U/L   AST 15  0 - 37 U/L   ALT 14  0 - 35 U/L   Total Protein 8.2  6.0 - 8.3 g/dL   Albumin 4.6  3.5 - 5.2 g/dL   Calcium 9.6  8.4 - 78.210.5 mg/dL  TSH      Result Value Ref Range   TSH 1.370  0.350 - 4.500 uIU/mL  T4, FREE      Result Value Ref Range   Free T4 1.03  0.80 - 1.80 ng/dL

## 2013-10-13 DIAGNOSIS — F411 Generalized anxiety disorder: Secondary | ICD-10-CM | POA: Insufficient documentation

## 2013-10-13 DIAGNOSIS — K219 Gastro-esophageal reflux disease without esophagitis: Secondary | ICD-10-CM | POA: Insufficient documentation

## 2013-10-13 LAB — T4, FREE: Free T4: 1.03 ng/dL (ref 0.80–1.80)

## 2013-10-13 LAB — COMPREHENSIVE METABOLIC PANEL
ALK PHOS: 38 U/L — AB (ref 39–117)
ALT: 14 U/L (ref 0–35)
AST: 15 U/L (ref 0–37)
Albumin: 4.6 g/dL (ref 3.5–5.2)
BILIRUBIN TOTAL: 0.4 mg/dL (ref 0.2–1.2)
BUN: 14 mg/dL (ref 6–23)
CO2: 26 mEq/L (ref 19–32)
CREATININE: 0.6 mg/dL (ref 0.50–1.10)
Calcium: 9.6 mg/dL (ref 8.4–10.5)
Chloride: 106 mEq/L (ref 96–112)
Glucose, Bld: 80 mg/dL (ref 70–99)
Potassium: 4.3 mEq/L (ref 3.5–5.3)
SODIUM: 139 meq/L (ref 135–145)
TOTAL PROTEIN: 8.2 g/dL (ref 6.0–8.3)

## 2013-10-13 LAB — TSH: TSH: 1.37 u[IU]/mL (ref 0.350–4.500)

## 2013-10-16 ENCOUNTER — Telehealth: Payer: Self-pay

## 2013-10-16 ENCOUNTER — Encounter: Payer: Self-pay | Admitting: Internal Medicine

## 2013-10-16 NOTE — Telephone Encounter (Signed)
PATIENT IS CALLING TO SAY THAT SHE IS HAVING SOME RXN TO THE ANXIETY MEDICATION THAT DR Merla RichesOLITTLE PRESCRIBED FOR HER

## 2013-10-17 NOTE — Telephone Encounter (Signed)
Labs all ok letter was sent paxil should not be cause of her new sxt rechk as planned

## 2013-10-17 NOTE — Telephone Encounter (Signed)
1. Pt states that since she has been taking Paxil periodically she is having leg pain and a cold feeling even when in a warm house.  She would like to know if this normal?  Also she would like her lab results.  Can you review?

## 2013-10-17 NOTE — Telephone Encounter (Signed)
Pt notified and will follow up and sooner if worse

## 2013-10-21 ENCOUNTER — Telehealth: Payer: Self-pay | Admitting: General Practice

## 2013-10-21 NOTE — Telephone Encounter (Signed)
Called and left vm for patient give us a call  Back and confirm if she can come in on 11/10/13 @10 :00  To see Doctor Merla Richesoolittle for office visit

## 2013-10-26 ENCOUNTER — Telehealth: Payer: Self-pay

## 2013-10-26 NOTE — Telephone Encounter (Signed)
PATIENT STATES SHE SAW DR. Merla RichesOLITTLE A FEW WEEKS AGO FOR ANXIETY. HE WAS TELLING HER SOMETHING ABOUT A MEDICATION SHE CAN USE FOR HER ANXIETY IN CASE OF EMERGENCY. SHE THINKS HE SAID XANAX? SHE WOULD LIKE HIM TO CALL THAT INTO HER PHARMACY PLEASE. BEST PHONE [phone] (CELL) [facility] [address]  MBC

## 2013-10-29 NOTE — Telephone Encounter (Signed)
Ok to call in xanax .25 # 20 1-2 at onset panic attack

## 2013-10-31 ENCOUNTER — Other Ambulatory Visit: Payer: Self-pay | Admitting: Family Medicine

## 2013-10-31 MED ORDER — ALPRAZOLAM 0.25 MG PO TABS: ORAL_TABLET | ORAL | Status: DC

## 2013-10-31 NOTE — Telephone Encounter (Signed)
[facility] left message on machine and left message on patients machine to pick medicine up

## 2013-11-10 ENCOUNTER — Ambulatory Visit (INDEPENDENT_AMBULATORY_CARE_PROVIDER_SITE_OTHER): Payer: No Typology Code available for payment source | Admitting: Internal Medicine

## 2013-11-10 ENCOUNTER — Encounter: Payer: Self-pay | Admitting: Internal Medicine

## 2013-11-10 VITALS — BP 115/72 | HR 82 | Temp 98.8°F | Resp 16 | Ht 64.75 in | Wt 102.4 lb

## 2013-11-10 DIAGNOSIS — F411 Generalized anxiety disorder: Secondary | ICD-10-CM

## 2013-11-10 MED ORDER — PAROXETINE HCL 10 MG PO TABS: 10.0000 mg | ORAL_TABLET | Freq: Every day | ORAL | Status: DC

## 2013-11-10 MED ORDER — ALPRAZOLAM 0.5 MG PO TABS: ORAL_TABLET | ORAL | Status: DC

## 2013-11-10 NOTE — Progress Notes (Signed)
  This chart was scribed for [name], MD by Joaquin MusicKristina Sanchez-Matthews, ED Scribe. This patient was seen in room Room/bed 27 and the patient's care was started at 10:33 AM. Subjective:    Patient ID: Molly Allen, female    DOB: 06/04/1986, 28 y.o.   MRN: [id number]  Anxiety     Molly Allen is a 28 y.o. female who [facility]. Pt states she has improved since her last visit. She states she has been taking Paxil  and states she feels she has better memory for the following day. Pt states after 10 days, she began taking 2 pills and states she was feeling more tired and states she had less energy. She states she requested Xanax to help her control her anxiety. She states she is taking 2 pills at this time due to having best control of her anxiety. Pt states her anxiety generally kicks in "right before she goes to bed". Pt states her prior medication made her feel very "drugged up" but denies having that feeling with her medications at this time. She states taking 1 pill is best for her. Pt states she would like to begin working but is considering going back to school. She states she would like to become a speech therapist and states her sons autism and her desire to become a therapist of some sort has influenced her decision. 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 She states she has been seeking employment at this time.   Pt states her children are 638 and 27 years old. She states she stays at home while her husband works during the day.  Review of Systems  Psychiatric/Behavioral: Negative for agitation.   Objective:   Physical Exam  Nursing note and vitals reviewed. Constitutional: She appears well-developed and well-nourished. No distress.  HENT:  Head: Normocephalic and atraumatic.  Eyes: Pupils are equal, round, and reactive to light.  Neck: Neck supple.  Cardiovascular:  Normal rate.   Pulmonary/Chest: Effort normal.  Neurological: She is alert.  Skin: She is not diaphoretic.    Assessment & Plan:  Generalized anxiety disorder  Meds ordered this encounter  Medications  . ALPRAZolam (XANAX) 0.5 MG tablet    Sig: 1-1.5  at onset panic attack    Dispense:  30 tablet    Refill:  2  . PARoxetine (PAXIL) 10 MG tablet    Sig: Take 1 tablet (10 mg total) by mouth daily.    Dispense:  30 tablet    Refill:  1   F/u 2 mos

## 2013-12-29 ENCOUNTER — Encounter: Payer: Self-pay | Admitting: Internal Medicine

## 2013-12-29 ENCOUNTER — Ambulatory Visit (INDEPENDENT_AMBULATORY_CARE_PROVIDER_SITE_OTHER): Payer: No Typology Code available for payment source | Admitting: Internal Medicine

## 2013-12-29 VITALS — BP 90/62 | HR 89 | Temp 98.3°F | Resp 16 | Ht 64.5 in | Wt 102.4 lb

## 2013-12-29 DIAGNOSIS — N946 Dysmenorrhea, unspecified: Secondary | ICD-10-CM

## 2013-12-29 DIAGNOSIS — N920 Excessive and frequent menstruation with regular cycle: Secondary | ICD-10-CM

## 2013-12-29 DIAGNOSIS — R1013 Epigastric pain: Secondary | ICD-10-CM

## 2013-12-29 DIAGNOSIS — F411 Generalized anxiety disorder: Secondary | ICD-10-CM

## 2013-12-29 MED ORDER — DICYCLOMINE HCL 20 MG PO TABS: 20.0000 mg | ORAL_TABLET | Freq: Three times a day (TID) | ORAL | Status: DC

## 2013-12-29 MED ORDER — PAROXETINE HCL 10 MG PO TABS: 10.0000 mg | ORAL_TABLET | Freq: Every day | ORAL | Status: DC

## 2013-12-29 NOTE — Progress Notes (Signed)
Subjective:    Patient ID: Molly Allen, female    DOB: 1986/07/16, 28 y.o.   MRN: [id number] This chart was scribed for Ellamae Siaobert Estill Llerena, MD by Valera CastleSteven Perry, ED Scribe. This patient was seen in room 24 and the patient's care was started at 1:35 PM.  Chief Complaint  Patient presents with  . Follow-up    GAD  . Medication Refill    Xanax 0.5 mg   HPI Molly Allen is a 28 y.o. female with h/o GAD and GERD who [facility] for a follow up of GAD.and Xanax refill.  She reports having bowel issues, including intermittent discomfort and frequent burping. She reports associated nausea after every meal, more so in the evenings. She denies her symptoms waking her up at night. She denies diarrhea and constipation. She reports anxiety with her bowel issues. She reports her body feels "off", but she is unsure as to what the cause is. Prior dx GERD but no current heartburn or nocturnal sxt.  She reports heavy menstrual flows with associated painful abdominal cramping, weakness, fatigue, nausea, and dehydration. The frequency of her periods is normal. She denies using any contraceptives. Last pap 8 wk after childbirth. No routine f/u.  She reports increasing to Paxil 20 mg after 10 days per last provider visit. She reports increased drowsiness with the increased dosage so she went back to taking 10 mg. She states she has more energy with the lower dosage. She is still taking her Xanax, prn panic.   Patient Active Problem List   Diagnosis Date Noted  . Generalized anxiety disorder 10/13/2013  . GERD (gastroesophageal reflux disease) 10/13/2013   Prior to Admission medications   Medication Sig Start Date End Date Taking? Authorizing Provider  Acetaminophen-Caff-Pyrilamine (MIDOL COMPLETE PO) Take 1 tablet by mouth every 8 (eight) hours as needed (pain).   Yes Historical Provider, MD  ALPRAZolam Prudy Feeler(XANAX) 0.5 MG tablet 1-1.5  at onset panic attack 11/10/13  Yes Tonye Pearsonobert P Timouthy Gilardi,  MD  Multiple Vitamin (MULTIVITAMIN WITH MINERALS) TABS tablet Take 1 tablet by mouth daily.   Yes Historical Provider, MD  PARoxetine (PAXIL) 10 MG tablet Take 1 tablet (10 mg total) by mouth daily. 11/10/13  Yes Tonye Pearsonobert P Addelyn Alleman, MD   Review of Systems  Constitutional: Positive for activity change (drowsiness with taking Paxil) and fatigue (secondary to heavy menstrual flow). Negative for fever.  HENT: Negative for congestion.   Respiratory: Negative for cough.   Gastrointestinal: Positive for nausea (secondary to eating and menstrual flow). Negative for diarrhea and constipation.       + for frequent burping  Genitourinary: Positive for menstrual problem (heavier flow). Negative for dysuria.  Musculoskeletal: Negative for arthralgias.  Skin: Negative for color change.  Neurological: Positive for weakness (secondary to heavy menstrual flow).  Psychiatric/Behavioral: Negative for sleep disturbance. The patient is nervous/anxious (pt states secondary to health issues).      Objective:   Physical Exam  Nursing note and vitals reviewed. Constitutional: She is oriented to person, place, and time. She appears well-developed and well-nourished. No distress.  HENT:  Head: Normocephalic and atraumatic.  Eyes: EOM are normal.  Neck: Neck supple.  Cardiovascular: Normal rate.   Pulmonary/Chest: Effort normal. No respiratory distress.  Musculoskeletal: Normal range of motion.  Neurological: She is alert and oriented to person, place, and time.  Skin: Skin is warm and dry.  Psychiatric: She has a normal mood and affect. Her behavior is normal.   BP 90/62  Pulse 89  Temp(Src) 98.3 F (36.8 C) (Oral)  Resp 16  Ht 5' 4.5" (1.638 m)  Wt 102 lb 6.4 oz (46.448 kg)  BMI 17.31 kg/m2  SpO2 99%  LMP 11/25/2013     Assessment & Plan:  Generalized anxiety disorder  Try to increase to 20mg  again and let me know to send in new rx if successful Dyspepsia  ?IBS--started before paxil  Trial  Bentyl before further w/u Menorrhagia  Appt w/ APP at 104 for full exam,PAP and consid for OCPs(needs contracep) Dysmenorrhea  Meds ordered this encounter  Medications  . dicyclomine (BENTYL) 20 MG tablet    Sig: Take 1 tablet (20 mg total) by mouth 3 (three) times daily before meals.    Dispense:  30 tablet    Refill:  1  . PARoxetine (PAXIL) 10 MG tablet    Sig: Take 1 tablet (10 mg total) by mouth daily.    Dispense:  30 tablet    Refill:  2         I have completed the patient encounter in its entirety as documented by the scribe, with editing by me where necessary. Alix Stowers P. Merla Richesoolittle, M.D.

## 2014-01-13 ENCOUNTER — Other Ambulatory Visit: Payer: Self-pay

## 2014-01-13 NOTE — Telephone Encounter (Signed)
Patient called requesting a refill on Xanax 0.5 mg. [facility] [address] [phone]

## 2014-01-13 NOTE — Telephone Encounter (Signed)
[facility]. Rx written at 11/10/13 OV was [facility] [place] [facility] and p/up 11/10/13. Then [facility] on 11/28/13 and again on 12/13/13. Dr Merla Richesoolittle, I don't know if you had intended each RF to last 30 days. It looks like it has been 30 days since last RF but she had gotten the earlier RFs more often. Pended for review.

## 2014-01-14 MED ORDER — ALPRAZOLAM 0.5 MG PO TABS: ORAL_TABLET | ORAL | Status: DC

## 2014-01-14 NOTE — Telephone Encounter (Signed)
Faxed and notified pt

## 2014-01-25 ENCOUNTER — Encounter: Payer: Self-pay | Admitting: Family Medicine

## 2014-01-25 ENCOUNTER — Ambulatory Visit (INDEPENDENT_AMBULATORY_CARE_PROVIDER_SITE_OTHER): Payer: No Typology Code available for payment source | Admitting: Family Medicine

## 2014-01-25 VITALS — BP 108/68 | HR 75 | Temp 98.1°F | Resp 16 | Ht 65.0 in | Wt 105.0 lb

## 2014-01-25 DIAGNOSIS — Z23 Encounter for immunization: Secondary | ICD-10-CM

## 2014-01-25 DIAGNOSIS — F411 Generalized anxiety disorder: Secondary | ICD-10-CM

## 2014-01-25 DIAGNOSIS — N920 Excessive and frequent menstruation with regular cycle: Secondary | ICD-10-CM

## 2014-01-25 DIAGNOSIS — Z124 Encounter for screening for malignant neoplasm of cervix: Secondary | ICD-10-CM

## 2014-01-25 DIAGNOSIS — Z0000 Encounter for general adult medical examination without abnormal findings: Secondary | ICD-10-CM

## 2014-01-25 LAB — CBC WITH DIFFERENTIAL/PLATELET
BASOS PCT: 0 % (ref 0–1)
Basophils Absolute: 0 10*3/uL (ref 0.0–0.1)
EOS PCT: 3 % (ref 0–5)
Eosinophils Absolute: 0.2 10*3/uL (ref 0.0–0.7)
HEMATOCRIT: 36.6 % (ref 36.0–46.0)
Hemoglobin: 12.3 g/dL (ref 12.0–15.0)
Lymphocytes Relative: 33 % (ref 12–46)
Lymphs Abs: 2.2 10*3/uL (ref 0.7–4.0)
MCH: 30.8 pg (ref 26.0–34.0)
MCHC: 33.6 g/dL (ref 30.0–36.0)
MCV: 91.5 fL (ref 78.0–100.0)
MONO ABS: 0.7 10*3/uL (ref 0.1–1.0)
Monocytes Relative: 10 % (ref 3–12)
NEUTROS ABS: 3.6 10*3/uL (ref 1.7–7.7)
Neutrophils Relative %: 54 % (ref 43–77)
Platelets: 253 10*3/uL (ref 150–400)
RBC: 4 MIL/uL (ref 3.87–5.11)
RDW: 12.2 % (ref 11.5–15.5)
WBC: 6.6 10*3/uL (ref 4.0–10.5)

## 2014-01-25 MED ORDER — NORGESTIMATE-ETH ESTRADIOL 0.25-35 MG-MCG PO TABS: 1.0000 | ORAL_TABLET | Freq: Every day | ORAL | Status: DC

## 2014-01-25 NOTE — Progress Notes (Signed)
Subjective:    Patient ID: Molly Allen, female    DOB: Mar 25, 1986, 28 y.o.   MRN: [id number]  HPI Patient reports today for annual exam. She sees Dr. Merla Riches for anxiety/panic attacks. Has been doing well on Paroxetine. Uses Xanax about 1-2 times per week. Panic attacks start with difficulty breathing, problem with swallowing, chest pain. She is unable to pinpoint particular triggers. Thinks panic attacks are likely related to daily stressors. Has had some increased stress with relationship with live in boyfriend (the father of her children). He accompanied her today for part of her visit. She was weighing whether or not they were going to stay together and they have decided to try to make their relationship work for the sake of the children.   Has two children (20 yo daughter, 40 yo son). Son with Asperger's.   FH- parents/siblings healthy SH- no tobacco, no ETOH, no illicit drugs PMH- GAD PSH- none  Sleeps pretty well. Occasional nocturia x 1. Little caffeine intake. Weight stable.  Continues to have dyspepsia 4x/week. Did not take medication. Has occasional cramping pain, usually more toward bedtime. Feels gassy. Occasionally has reflux symptoms. Has had some intermittent constipation, having every other day BM, occasionally every 3-4 days. Sometimes aggravated by spicy foods.  Patient interested in contraception. Currently uses condoms occasionally. Is having heavy periods every month. First 2 days, goes through pads every 2-3 hours. Periods lasting 7 days, usually 30-35 day cycle. Took oral contraceptives in the past, no side effects. She did become pregnant while taking OCPs, she states this was because she did not know to take an additional pill if she gets sick or misses one. States she is more mature now and thinks she can be more compliant.. Does not smoke. No history of DVT or clotting disorder.  Review of Systems No vomiting, occasional nausea. No chest pain, no SOB, no  vaginal discharge, no dysuria, no hematuria, no frequency.     Objective:   Physical Exam  Vitals reviewed. Constitutional: She is oriented to person, place, and time. She appears well-developed and well-nourished. No distress.  HENT:  Head: Normocephalic and atraumatic.  Right Ear: Tympanic membrane, external ear and ear canal normal.  Left Ear: Tympanic membrane, external ear and ear canal normal.  Nose: Nose normal.  Mouth/Throat: Oropharynx is clear and moist.  Neck: Normal range of motion. Neck supple. No thyromegaly present.  Cardiovascular: Normal rate, regular rhythm, normal heart sounds and intact distal pulses.   Pulmonary/Chest: Effort normal and breath sounds normal.  Abdominal: Soft. Bowel sounds are normal. She exhibits no distension and no mass. There is no tenderness. There is no rebound and no guarding.  Genitourinary: Vagina normal. Pelvic exam was performed with patient prone. There is no rash, tenderness, lesion or injury on the right labia. There is no rash, tenderness, lesion or injury on the left labia. Cervix exhibits no discharge and no friability. No erythema, tenderness or bleeding around the vagina. No foreign body around the vagina. No signs of injury around the vagina. No vaginal discharge found.  Musculoskeletal: Normal range of motion. She exhibits no edema and no tenderness.  Lymphadenopathy:    She has no cervical adenopathy.  Neurological: She is alert and oriented to person, place, and time.  Skin: Skin is warm and dry. She is not diaphoretic.  Psychiatric: She has a normal mood and affect. Her behavior is normal. Judgment and thought content normal.      Assessment & Plan:  1.  Menorrhagia - norgestimate-ethinyl estradiol (ORTHO-CYCLEN,SPRINTEC,PREVIFEM) 0.25-35 MG-MCG tablet; Take 1 tablet by mouth daily.  Dispense: 1 Package; Refill: 11 - CBC with Differential -Provided written and verbal instructions for starting OCPs.  2. Need for  diphtheria-tetanus-pertussis (Tdap) vaccine, adult/adolescent - Tdap vaccine greater than or equal to 7yo IM  3. Pap smear for cervical cancer screening - Pap IG, CT/NG w/ reflex HPV when ASC-U  4. GAD -Continue paroxetine and alprazolam as needed -F/U with Dr. Merla Richesoolittle as scheduled 7/15  5. Dyspepsia -encouraged patient to try Bentyl that was prescribed for her from the last visit -Treat constipation sooner rather than later, recommended Miralax prn. -Will discuss with Dr. Merla Richesoolittle at follow up visit.  Emi Belfasteborah B. Lillyann Ahart, FNP-BC  [facility], [facility]  01/25/2014 12:14 PM

## 2014-01-25 NOTE — Patient Instructions (Addendum)
Oral Contraception Use Oral contraceptive pills (OCPs) are medicines taken to prevent pregnancy. OCPs work by preventing the ovaries from releasing eggs. The hormones in OCPs also cause the cervical mucus to thicken, preventing the sperm from entering the uterus. The hormones also cause the uterine lining to become thin, not allowing a fertilized egg to attach to the inside of the uterus. OCPs are highly effective when taken exactly as prescribed. However, OCPs do not prevent sexually transmitted diseases (STDs). Safe sex practices, such as using condoms along with an OCP, can help prevent STDs. Before taking OCPs, you may have a physical exam and Pap test. Your health care provider may also order blood tests if necessary. Your health care provider will make sure you are a good candidate for oral contraception. Discuss with your health care provider the possible side effects of the OCP you may be prescribed. When starting an OCP, it can take 2 to 3 months for the body to adjust to the changes in hormone levels in your body.  HOW TO TAKE ORAL CONTRACEPTIVE PILLS Your health care provider may advise you on how to start taking the first cycle of OCPs. Otherwise, you can:   Start on day 1 of your menstrual period. You will not need any backup contraceptive protection with this start time.   Start on the first Sunday after your menstrual period or the day you get your prescription. In these cases, you will need to use backup contraceptive protection for the first week.   Start the pill at any time of your cycle. If you take the pill within 5 days of the start of your period, you are protected against pregnancy right away. In this case, you will not need a backup form of birth control. If you start at any other time of your menstrual cycle, you will need to use another form of birth control for 7 days. If your OCP is the type called a minipill, it will protect you from pregnancy after taking it for 2 days (48  hours). After you have started taking OCPs:   If you forget to take 1 pill, take it as soon as you remember. Take the next pill at the regular time.   If you miss 2 or more pills, call your health care provider because different pills have different instructions for missed doses. Use backup birth control until your next menstrual period starts.   If you use a 28-day pack that contains inactive pills and you miss 1 of the last 7 pills (pills with no hormones), it will not matter. Throw away the rest of the nonhormone pills and start a new pill pack.  No matter which day you start the OCP, you will always start a new pack on that same day of the week. Have an extra pack of OCPs and a backup contraceptive method available in case you miss some pills or lose your OCP pack.  HOME CARE INSTRUCTIONS   Do not smoke.   Always use a condom to protect against STDs. OCPs do not protect against STDs.   Use a calendar to mark your menstrual period days.   Read the information and directions that came with your OCP. Talk to your health care provider if you have questions.  SEEK MEDICAL CARE IF:   You develop nausea and vomiting.   You have abnormal vaginal discharge or bleeding.   You develop a rash.   You miss your menstrual period.   You are losing   your hair.   You need treatment for mood swings or depression.   You get dizzy when taking the OCP.   You develop acne from taking the OCP.   You become pregnant.  SEEK IMMEDIATE MEDICAL CARE IF:   You develop chest pain.   You develop shortness of breath.   You have an uncontrolled or severe headache.   You develop numbness or slurred speech.   You develop visual problems.   You develop pain, redness, and swelling in the legs.  Document Released: 08/08/2011 Document Revised: 04/21/2013 Document Reviewed: 02/07/2013 Tetanus, Diphtheria, Pertussis (Tdap) Vaccine What You Need to Know WHY GET  VACCINATED? Tetanus, diphtheria and pertussis can be very serious diseases, even for adolescents and adults. Tdap vaccine can protect us from these diseases. TETANUS (Lockjaw) causes painful muscle tightening and stiffness, usually all over the body.  It can lead to tightening of muscles in the head and neck so you can't open your mouth, swallow, or sometimes even breathe. Tetanus kills about 1 out of 5 people who are infected. DIPHTHERIA can cause a thick coating to form in the back of the throat.  It can lead to breathing problems, paralysis, heart failure, and death. PERTUSSIS (Whooping Cough) causes severe coughing spells, which can cause difficulty breathing, vomiting and disturbed sleep.  It can also lead to weight loss, incontinence, and rib fractures. Up to 2 in 100 adolescents and 5 in 100 adults with pertussis are hospitalized or have complications, which could include pneumonia and death. These diseases are caused by bacteria. Diphtheria and pertussis are spread from person to person through coughing or sneezing. Tetanus enters the body through cuts, scratches, or wounds. Before vaccines, the [place] saw as many as 200,000 cases a year of diphtheria and pertussis, and hundreds of cases of tetanus. Since vaccination began, tetanus and diphtheria have dropped by about 99% and pertussis by about 80%. TDAP VACCINE Tdap vaccine can protect adolescents and adults from tetanus, diphtheria, and pertussis. One dose of Tdap is routinely given at age 10911 or 2112. People who did not get Tdap at that age should get it as soon as possible. Tdap is especially important for health care professionals and anyone having close contact with a baby younger than 12 months. Pregnant women should get a dose of Tdap during every pregnancy, to protect the newborn from pertussis. Infants are most at risk for severe, life-threatening complications from pertussis. A similar vaccine, called Td, protects from tetanus  and diphtheria, but not pertussis. A Td booster should be given every 10 years. Tdap may be given as one of these boosters if you have not already gotten a dose. Tdap may also be given after a severe cut or burn to prevent tetanus infection. Your doctor can give you more information. Tdap may safely be given at the same time as other vaccines. SOME PEOPLE SHOULD NOT GET THIS VACCINE  If you ever had a life-threatening allergic reaction after a dose of any tetanus, diphtheria, or pertussis containing vaccine, OR if you have a severe allergy to any part of this vaccine, you should not get Tdap. Tell your doctor if you have any severe allergies.  If you had a coma, or long or multiple seizures within 7 days after a childhood dose of DTP or DTaP, you should not get Tdap, unless a cause other than the vaccine was found. You can still get Td.  Talk to your doctor if you:  have epilepsy or another nervous system  problem,  had severe pain or swelling after any vaccine containing diphtheria, tetanus or pertussis,  ever had Guillain-Barr Syndrome (GBS),  aren't feeling well on the day the shot is scheduled. RISKS OF A VACCINE REACTION With any medicine, including vaccines, there is a chance of side effects. These are usually mild and go away on their own, but serious reactions are also possible. Brief fainting spells can follow a vaccination, leading to injuries from falling. Sitting or lying down for about 15 minutes can help prevent these. Tell your doctor if you feel dizzy or light-headed, or have vision changes or ringing in the ears. Mild problems following Tdap (Did not interfere with activities)  Pain where the shot was given (about 3 in 4 adolescents or 2 in 3 adults)  Redness or swelling where the shot was given (about 1 person in 5)  Mild fever of at least 100.71F (up to about 1 in 25 adolescents or 1 in 100 adults)  Headache (about 3 or 4 people in 10)  Tiredness (about 1 person in 3  or 4)  Nausea, vomiting, diarrhea, stomach ache (up to 1 in 4 adolescents or 1 in 10 adults)  Chills, body aches, sore joints, rash, swollen glands (uncommon) Moderate problems following Tdap (Interfered with activities, but did not require medical attention)  Pain where the shot was given (about 1 in 5 adolescents or 1 in 100 adults)  Redness or swelling where the shot was given (up to about 1 in 16 adolescents or 1 in 25 adults)  Fever over 102F (about 1 in 100 adolescents or 1 in 250 adults)  Headache (about 3 in 20 adolescents or 1 in 10 adults)  Nausea, vomiting, diarrhea, stomach ache (up to 1 or 3 people in 100)  Swelling of the entire arm where the shot was given (up to about 3 in 100). Severe problems following Tdap (Unable to perform usual activities, required medical attention)  Swelling, severe pain, bleeding and redness in the arm where the shot was given (rare). A severe allergic reaction could occur after any vaccine (estimated less than 1 in a million doses). WHAT IF THERE IS A SERIOUS REACTION? What should I look for?  Look for anything that concerns you, such as signs of a severe allergic reaction, very high fever, or behavior changes. Signs of a severe allergic reaction can include hives, swelling of the face and throat, difficulty breathing, a fast heartbeat, dizziness, and weakness. These would start a few minutes to a few hours after the vaccination. What should I do?  If you think it is a severe allergic reaction or other emergency that can't wait, call 9-1-1 or get the person to the nearest hospital. Otherwise, call your doctor.  Afterward, the reaction should be reported to the "Vaccine Adverse Event Reporting System" (VAERS). Your doctor might file this report, or you can do it yourself through the VAERS web site at www.vaers.LAgents.no, or by calling 1-803-540-4875. VAERS is only for reporting reactions. They do not give medical advice.  THE NATIONAL VACCINE  INJURY COMPENSATION PROGRAM The National Vaccine Injury Compensation Program (VICP) is a federal program that was created to compensate people who may have been injured by certain vaccines. Persons who believe they may have been injured by a vaccine can learn about the program and about filing a claim by calling 1-(279)754-8700 or visiting the VICP website at SpiritualWord.at. HOW CAN I LEARN MORE?  Ask your doctor.  Call your local or state health department.  Contact the Centers for Disease Control and Prevention (CDC):  Call 909-004-8230 or visit CDC's website at PicCapture.uy. CDC Tdap Vaccine VIS (01/09/12) Document Released: 02/18/2012 Document Revised: 12/14/2012 Document Reviewed: 12/09/2012 [facility] Patient Information 2014 [place], [place]. [employer] Patient Information 2014 [place], [place].

## 2014-01-26 LAB — PAP IG, CT-NG, RFX HPV ASCU
Chlamydia Probe Amp: NEGATIVE
GC PROBE AMP: NEGATIVE

## 2014-01-31 ENCOUNTER — Other Ambulatory Visit: Payer: Self-pay | Admitting: Internal Medicine

## 2014-02-01 ENCOUNTER — Encounter: Payer: No Typology Code available for payment source | Admitting: Family Medicine

## 2014-02-13 ENCOUNTER — Telehealth: Payer: Self-pay

## 2014-02-13 NOTE — Telephone Encounter (Signed)
The patient called, very concerned that she started birth control pills 2 weeks ago and has started her period again today.  The patient requests return call at [phone] to discuss situation.

## 2014-02-13 NOTE — Telephone Encounter (Signed)
Spoke with pt and informed her it is normal when you first start [facility] to have spotting and bleeding. I told her if she develops cramping and severe abdominal pain that she should come in and be seen. She was relieved to hear that it is normal to have some bleeding when you first start the [facility].

## 2014-02-17 ENCOUNTER — Telehealth: Payer: Self-pay

## 2014-02-17 NOTE — Telephone Encounter (Signed)
Luisa Hartatrick called and states that patient's insurance company requests that [facility] start [facility]. Patient wants copy of her medical records (last 3 years) and will sign the release once they are ready to be picked up. Please process request and call back once records are ready for pick-up.

## 2014-02-18 NOTE — Telephone Encounter (Signed)
Called patient and left a message to let her know that her records were ready for pick-up. Informed patient that she needed to fill out a release form when she comes.

## 2014-02-24 ENCOUNTER — Telehealth: Payer: Self-pay

## 2014-02-24 NOTE — Telephone Encounter (Signed)
Patient called yesterday to have someone put in a message that she would like to be switched on her birth control. She was promised yesterday by someone she would get a call back. I don't see any messages in epic. She says that the birth control makes her sick and wants a different brand. She has 3 pills left and needs to talk to someone today since she keeps taking them and gets very sick  Best: [phone]

## 2014-02-25 NOTE — Telephone Encounter (Signed)
Called patient, no answer 

## 2014-02-25 NOTE — Telephone Encounter (Signed)
Pt states the [facility] are making her sick about an hour after she takes them. She has lost her insurance. She is wondering if we can switch her BC to something different and that is affordable without insurance. I have advised her to stop taking the pills and use an alternative form of contraception.

## 2014-02-25 NOTE — Telephone Encounter (Signed)
Spoke with patient regarding her birth control pills. She has noticed nausea after taking her birth control. She takes it around 8 o'clock at night and has nausea until she goes to sleep. She is having problems with her insurance prescription coverage and is having to pay [facility] month at [facility]. I informed her that [facility] typically [facility]. She will look into having her prescription changed and will let me know if she has further problems or questions.

## 2014-03-30 ENCOUNTER — Ambulatory Visit: Payer: No Typology Code available for payment source | Admitting: Internal Medicine

## 2014-04-11 ENCOUNTER — Telehealth: Payer: Self-pay

## 2014-04-11 MED ORDER — ALPRAZOLAM 0.5 MG PO TABS: ORAL_TABLET | ORAL | Status: DC

## 2014-04-11 NOTE — Telephone Encounter (Signed)
Pt has an appt on 06/08/2014

## 2014-04-11 NOTE — Telephone Encounter (Signed)
Meds ordered this encounter  Medications  . ALPRAZolam (XANAX) 0.5 MG tablet    Sig: 1-1.5  at onset panic attack    Dispense:  30 tablet    Refill:  2

## 2014-04-11 NOTE — Telephone Encounter (Signed)
Patient is wanting a refill on her xanax until she can come in and see Dr Merla Richesoolittle on 06/08/14.   Best#:  [phone]

## 2014-04-12 ENCOUNTER — Telehealth: Payer: Self-pay | Admitting: Family Medicine

## 2014-04-12 NOTE — Telephone Encounter (Signed)
Spoke to pt, she is aware rx ready for p/u 

## 2014-05-27 ENCOUNTER — Telehealth: Payer: Self-pay

## 2014-05-27 NOTE — Telephone Encounter (Signed)
Spoke to pt- she is having some insurance issues and has received a bill from [place]. She wants to make some kind of arraingment so that she may come in tomorrow to see Dr. Merla Riches. I have transferred her to the billing dept.

## 2014-05-27 NOTE — Telephone Encounter (Signed)
Pt sees Dr. Merla Riches for anxiety and was told that when she goes back to work that she should not work more than 20-25 hours for the first few months. She was also told that a note can be provided from our office detailing this. Please call pt when this is ready for pickup!

## 2014-05-27 NOTE — Telephone Encounter (Signed)
It has been too long since i have seen her to comment on this so need f/u( and for further meds anyway)

## 2014-06-08 ENCOUNTER — Ambulatory Visit (INDEPENDENT_AMBULATORY_CARE_PROVIDER_SITE_OTHER): Payer: Self-pay | Admitting: Internal Medicine

## 2014-06-08 ENCOUNTER — Ambulatory Visit: Payer: Self-pay | Admitting: Internal Medicine

## 2014-06-08 VITALS — BP 111/71 | HR 85 | Temp 97.7°F | Resp 16 | Ht 64.5 in | Wt 113.0 lb

## 2014-06-08 DIAGNOSIS — F411 Generalized anxiety disorder: Secondary | ICD-10-CM

## 2014-06-08 DIAGNOSIS — N920 Excessive and frequent menstruation with regular cycle: Secondary | ICD-10-CM

## 2014-06-08 MED ORDER — PAROXETINE HCL 10 MG PO TABS: 10.0000 mg | ORAL_TABLET | Freq: Every day | ORAL | Status: DC

## 2014-06-08 MED ORDER — ALPRAZOLAM 0.5 MG PO TABS: ORAL_TABLET | ORAL | Status: DC

## 2014-06-08 NOTE — Progress Notes (Signed)
   Subjective:    Patient ID: Beryle FlockSalina R Rotondo, female    DOB: [birth year], 28 y.o.   MRN: [id number]  HPIsee past Patient Active Problem List   Diagnosis Date Noted  . Generalized anxiety disorder 10/13/2013  . 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 Constitutional: She is oriented to person, place, and time. She appears well-developed and well-nourished. No distress.  HENT:  Head: Normocephalic and atraumatic.  Eyes: Pupils are equal, round, and reactive to light.  Neck: Normal range of motion.  Cardiovascular: Normal rate and regular rhythm.   Pulmonary/Chest: Effort normal. No respiratory distress.  Musculoskeletal: Normal range of motion.  Neurological: She is alert and oriented to person, place, and time.  Skin: Skin is warm and dry.  Psychiatric: She has a normal mood and affect. Her behavior is normal.   BP 111/71  Pulse 85  Temp(Src) 97.7 F (36.5 C)  Resp 16  Ht 5' 4.5" (1.638 m)  Wt 113 lb (51.256 kg)  BMI 19.10 kg/m2  SpO2 99%  LMP 05/26/2014        Assessment & Plan:  Generalized anxiety disorder  Menorrhagia with regular cycle  Meds ordered this encounter  Medications  . ALPRAZolam (XANAX) 0.5 MG tablet    Sig: 1-1.5  at onset panic attack    Dispense:  30 tablet    Refill:  2  . PARoxetine (PAXIL) 10 MG tablet    Sig: Take 1 tablet (10 mg total) by mouth daily.    Dispense:  90 tablet    Refill:  3   F/u 96mos-1 yr if remains stable and improving

## 2014-06-09 ENCOUNTER — Telehealth: Payer: Self-pay

## 2014-06-09 NOTE — Telephone Encounter (Signed)
Letter is up front for p/u and pt notified.

## 2014-06-09 NOTE — Telephone Encounter (Signed)
Patient was seen yesterday at the appointment center with Dr. Merla Richesoolittle. Patient was given a work note to excuse her from being late from yesterday but she now wants one to excuse her for the whole day. Per patient she was unable to make it to work yesterday. Patients call back number is [phone]-per patient ok to leave a detail message at this number.

## 2014-09-07 ENCOUNTER — Emergency Department (INDEPENDENT_AMBULATORY_CARE_PROVIDER_SITE_OTHER)
Admission: EM | Admit: 2014-09-07 | Discharge: 2014-09-07 | Disposition: A | Discharge status: Home / Self Care | Payer: Self-pay | Source: Home / Self Care | Attending: Family Medicine | Admitting: Family Medicine

## 2014-09-07 ENCOUNTER — Encounter (HOSPITAL_COMMUNITY): Payer: Self-pay | Admitting: Emergency Medicine

## 2014-09-07 DIAGNOSIS — N912 Amenorrhea, unspecified: Secondary | ICD-10-CM

## 2014-09-07 LAB — POCT URINALYSIS DIP (DEVICE)
BILIRUBIN URINE: NEGATIVE
GLUCOSE, UA: NEGATIVE mg/dL
Hgb urine dipstick: NEGATIVE
Ketones, ur: NEGATIVE mg/dL
LEUKOCYTES UA: NEGATIVE
NITRITE: NEGATIVE
PROTEIN: NEGATIVE mg/dL
SPECIFIC GRAVITY, URINE: 1.025 (ref 1.005–1.030)
Urobilinogen, UA: 0.2 mg/dL (ref 0.0–1.0)
pH: 7 (ref 5.0–8.0)

## 2014-09-07 LAB — POCT PREGNANCY, URINE: PREG TEST UR: NEGATIVE

## 2014-09-07 LAB — HCG, SERUM, QUALITATIVE: Preg, Serum: NEGATIVE

## 2014-09-07 LAB — TSH: TSH: 0.772 u[IU]/mL (ref 0.350–4.500)

## 2014-09-07 NOTE — ED Provider Notes (Signed)
Molly Allen is a 29 y.o. female who presents to Urgent Care today for amenorrhea. Patient had a menstrual period on November 18 to the 25th. She's not had a period since. She feels well with no medical complaints. She's had 2 negative home pregnancy tests since. No primary care doctor or health insurance.   Past Medical History  Diagnosis Date  . Anxiety    History reviewed. No pertinent past surgical history. History  Substance Use Topics  . Smoking status: Never Smoker   . Smokeless tobacco: Not on file  . Alcohol Use: No   ROS as above Medications: No current facility-administered medications for this encounter.   Current Outpatient Prescriptions  Medication Sig Dispense Refill  . PARoxetine (PAXIL) 10 MG tablet Take 1 tablet (10 mg total) by mouth daily. 90 tablet 3  . Acetaminophen-Caff-Pyrilamine (MIDOL COMPLETE PO) Take 1 tablet by mouth every 8 (eight) hours as needed (pain).    Marland Kitchen. ALPRAZolam (XANAX) 0.5 MG tablet 1-1.5  at onset panic attack 30 tablet 2  . dicyclomine (BENTYL) 20 MG tablet Take 1 tablet (20 mg total) by mouth 3 (three) times daily before meals. 30 tablet 1  . Multiple Vitamin (MULTIVITAMIN WITH MINERALS) TABS tablet Take 1 tablet by mouth daily.    . norgestimate-ethinyl estradiol (ORTHO-CYCLEN,SPRINTEC,PREVIFEM) 0.25-35 MG-MCG tablet Take 1 tablet by mouth daily. 1 Package 11  . PARoxetine (PAXIL) 10 MG tablet TAKE 1 TABLET BY MOUTH EVERY DAY **MAY INCREASE TO 2 TABLETS DAILY IN 10 DAYS** 60 tablet 5   No Known Allergies   Exam:  BP 113/70 mmHg  Pulse 81  Temp(Src) 97.5 F (36.4 C) (Oral)  Resp 16  SpO2 100%  LMP 07/20/2014 Gen: Well NAD HEENT: EOMI,  MMM Lungs: Normal work of breathing. CTABL Heart: RRR no MRG Abd: NABS, Soft. Nondistended, Nontender Exts: Brisk capillary refill, warm and well perfused.   Results for orders placed or performed during the hospital encounter of 09/07/14 (from the past 24 hour(s))  POCT urinalysis dip  (device)     Status: None   Collection Time: 09/07/14  1:58 PM  Result Value Ref Range   Glucose, UA NEGATIVE NEGATIVE mg/dL   Bilirubin Urine NEGATIVE NEGATIVE   Ketones, ur NEGATIVE NEGATIVE mg/dL   Specific Gravity, Urine 1.025 1.005 - 1.030   Hgb urine dipstick NEGATIVE NEGATIVE   pH 7.0 5.0 - 8.0   Protein, ur NEGATIVE NEGATIVE mg/dL   Urobilinogen, UA 0.2 0.0 - 1.0 mg/dL   Nitrite NEGATIVE NEGATIVE   Leukocytes, UA NEGATIVE NEGATIVE  Pregnancy, urine POC     Status: None   Collection Time: 09/07/14  2:05 PM  Result Value Ref Range   Preg Test, Ur NEGATIVE NEGATIVE   No results found.  Assessment and Plan: 29 y.o. female with amenorrhea. Unclear explanation. Qualitative serum pregnancy test and TSH pending. Follow up with community health and wellness.  Discussed warning signs or symptoms. Please see discharge instructions. Patient expresses understanding.     Rodolph BongEvan S Zalman Hull, MD 09/07/14 [phone]

## 2014-09-07 NOTE — Discharge Instructions (Signed)
Thank you for coming in today.  Please call or see Ms Antionette Char for assistance with your bill.  You may qualify for reduced or free services.  Her phone number is [phone]. Her email is [email or web address]   Primary Amenorrhea  Primary amenorrhea is the absence of any menstrual flow in a female by the age of 15 years. An average age for the start of menstruation is the age of 12 years. Primary amenorrhea is not considered to have occurred until a female is older than 15 years and has never menstruated. This may occur with or without other signs of puberty. CAUSES  Some common causes of not menstruating include:  Chromosomal abnormality causing the ovaries to malfunction is the most common cause of primary amenorrhea.  Malnutrition.  Low blood sugar (hypoglycemia).  Polycystic ovary syndrome (cysts in the ovaries, not ovulating).  Absence of the vagina, uterus, or ovaries since birth (congenital).  Extreme obesity.  Cystic fibrosis.  Drastic weight loss from any cause.  Over-exercising (running, biking) causing loss of body fat.  Pituitary gland tumor in the brain.  Long-term (chronic) illnesses.  Cushing disease.  Thyroid disease (hypothyroidism, hyperthyroidism).  Part of the brain (hypothalamus) not functioning normally.  Premature ovarian failure. SYMPTOMS  No menstruation by age 45 years in normally developed females is the primary symptom. Other symptoms may include:  Discharge from the breasts.  Hot flashes.  Adult acne.  Facial or chest hair.  Headaches.  Impaired vision.  Recent stress.  Changes in weight, diet, or exercise patterns. DIAGNOSIS  Primary amenorrhea is diagnosed with the help of a medical history and a physical exam. Other tests that may be recommended include:  Blood tests to check for pregnancy, hormonal changes, a bleeding or thyroid disorder, low iron levels (anemia), or other problems.  Urine  tests.  Specialized X-ray exams. TREATMENT  Treatment will depend on the cause. For example, some of the causes of primary amenorrhea, such as congenital absence of sex organs, will require surgery to correct. Others may respond to treatment with medicine. SEEK MEDICAL CARE IF:  There has not been any menstrual flow by age 44 years.  Body maturation does not occur at a level typical of peers.  Pelvic area pain occurs.  There is unusual weight gain or hair growth. Document Released: 08/19/2005 Document Revised: 06/09/2013 Document Reviewed: 03/31/2013 [facility] Patient Information 2015 [place], [place]. This information is not intended to replace advice given to you by your health care provider. Make sure you discuss any questions you have with your health care provider.    Secondary Amenorrhea  Secondary amenorrhea is the stopping of menstrual flow for 3-6 months in a female who has previously had periods. There are many possible causes. Most of these causes are not serious. Usually, treating the underlying problem causing the loss of menses will return your periods to normal. CAUSES  Some common and uncommon causes of not menstruating include:  Malnutrition.  Low blood sugar (hypoglycemia).  Polycystic ovary disease.  Stress or fear.  Breastfeeding.  Hormone imbalance.  Ovarian failure.  Medicines.  Extreme obesity.  Cystic fibrosis.  Low body weight or drastic weight reduction from any cause.  Early menopause.  Removal of ovaries or uterus.  Contraceptives.  Illness.  Long-term (chronic) illnesses.  Cushing syndrome.  Thyroid problems.  Birth control pills, patches, or vaginal rings for birth control. RISK FACTORS You may be at greater risk of secondary amenorrhea if:  You have a family history of this condition.  You have an eating disorder.  You do athletic training. DIAGNOSIS  A diagnosis is made by your health care provider taking a medical  history and doing a physical exam. This will include a pelvic exam to check for problems with your reproductive organs. Pregnancy must be ruled out. Often, numerous blood tests are done to measure different hormones in the body. Urine testing may be done. Specialized exams (ultrasound, CT scan, MRI, or hysteroscopy) may have to be done as well as measuring the body mass index (BMI). TREATMENT  Treatment depends on the cause of the amenorrhea. If an eating disorder is present, this can be treated with an adequate diet and therapy. Chronic illnesses may improve with treatment of the illness. Amenorrhea may be corrected with medicines, lifestyle changes, or surgery. If the amenorrhea cannot be corrected, it is sometimes possible to create a false menstruation with medicines. HOME CARE INSTRUCTIONS  Maintain a healthy diet.  Manage weight problems.  Exercise regularly but not excessively.  Get adequate sleep.  Manage stress.  Be aware of changes in your menstrual cycle. Keep a record of when your periods occur. Note the date your period starts, how long it lasts, and any problems. SEEK MEDICAL CARE IF: Your symptoms do not get better with treatment. Document Released: 09/30/2006 Document Revised: 04/21/2013 Document Reviewed: 02/04/2013 [facility] Patient Information 2015 [place], [place]. This information is not intended to replace advice given to you by your health care provider. Make sure you discuss any questions you have with your health care provider.

## 2014-09-07 NOTE — ED Notes (Signed)
Pt reports she is late on her menstrual; LMP = 07/20/2014  Has taken 2 home preg and both were negative Alert, no signs of acute distress.

## 2014-09-09 ENCOUNTER — Telehealth (HOSPITAL_COMMUNITY): Payer: Self-pay | Admitting: *Deleted

## 2014-09-09 NOTE — ED Notes (Signed)
TSH 0.772 and serum preg. neg.  Dr. Denyse Amassorey asked me to notify pt. of her results. I called and left a message on home and cell numbers.  Call 1. Vassie MoselleYork, Ermalinda Joubert M 09/09/2014

## 2014-09-09 NOTE — ED Notes (Signed)
Pt. called back and left a message.  I called pt. back. Pt. verified x 2 and given results.  Pt. told they were normal and Dr. Denyse Amassorey wants her to get the orange card.  [facility] and let them refer her to an OB-GYN.  Pt. Voiced understanding. Vassie MoselleYork, Sania Noy M 09/09/2014

## 2014-12-02 ENCOUNTER — Telehealth: Payer: Self-pay

## 2014-12-02 NOTE — Telephone Encounter (Signed)
PATIENT WOULD LIKE DR. DOOLITTLE TO WRITE HER A WORK NOTE FOR A NEW JOB THAT SHE HAS JUST STARTED. SHE WOULD LIKE IT TO SAY THAT SHE SUFFERS FROM AN ANXIETY DISORDER AND THAT SHE TAKES PAXIL 10 MG EVERY NIGHT. 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 PLEASE CALL HER WHEN IT CAN BE PICKED UP. BEST PHONE [phone] (CELL) MBC

## 2014-12-06 NOTE — Telephone Encounter (Signed)
Left message on machine that letter for employer is up front for pick up.

## 2014-12-06 NOTE — Telephone Encounter (Signed)
i can write letter--but can't make them hold to 30 hrs a week--can only ask that they consider it as part of treatment

## 2015-06-09 ENCOUNTER — Ambulatory Visit: Payer: Self-pay | Admitting: Internal Medicine

## 2015-08-12 ENCOUNTER — Ambulatory Visit (INDEPENDENT_AMBULATORY_CARE_PROVIDER_SITE_OTHER): Payer: 59 | Admitting: Internal Medicine

## 2015-08-12 VITALS — BP 110/64 | HR 68 | Temp 97.6°F | Resp 18 | Ht 65.5 in | Wt 125.6 lb

## 2015-08-12 DIAGNOSIS — S39012A Strain of muscle, fascia and tendon of lower back, initial encounter: Secondary | ICD-10-CM | POA: Diagnosis not present

## 2015-08-12 DIAGNOSIS — F411 Generalized anxiety disorder: Secondary | ICD-10-CM | POA: Diagnosis not present

## 2015-08-12 MED ORDER — MELOXICAM 15 MG PO TABS: 15.0000 mg | ORAL_TABLET | Freq: Every day | ORAL | Status: AC

## 2015-08-12 MED ORDER — TRAMADOL HCL 50 MG PO TABS: 50.0000 mg | ORAL_TABLET | Freq: Every day | ORAL | Status: DC

## 2015-08-12 MED ORDER — TIZANIDINE HCL 4 MG PO TABS: 4.0000 mg | ORAL_TABLET | Freq: Every day | ORAL | Status: AC

## 2015-08-12 NOTE — Progress Notes (Signed)
Subjective:  This chart was scribed for Ellamae Sia, MD by Stann Ore, Medical Scribe. This patient was seen in Room 1 and the patient's care was started at 10:03 AM.    Patient ID: Molly Allen, female    DOB: [birth year], 29 y.o.   MRN: [id number] Chief Complaint  Patient presents with  . Back Pain    lower back pain that radiates to tailbone x1 week  . Medication Refill    Paxil      HPI Molly Allen is a 29 y.o. female who [facility] complaining of gradual onset intermittent lower back pain with tightness that noticed a week ago. 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 She's been wearing a back brace recently with little relief. The pain would wake her up at night. She has taken muscle relaxers for the pain but it made her groggy in the morning.   Note in careeverywh she has been seen on several occasions since 2014 at [facility] ?did the adequate PT (Review of medication shows outside prescriptions for prednisone and Robaxin recently which she did not mention during this visit.)  She also requested medication refill on paxil 10 mg. She was increased to 20 mg at one time, but it gave her side effects. She does well on the 10 mg and also calms her stomach(IBS!).  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  Patient Active Problem List   Diagnosis Date Noted  . Generalized anxiety disorder 10/13/2013  . GERD (gastroesophageal reflux disease) 10/13/2013    Current outpatient prescriptions:  Marland Kitchen  Multiple Vitamin (MULTIVITAMIN WITH MINERALS) TABS tablet, Take 1 tablet by mouth daily., Disp: , Rfl:  .  norgestimate-ethinyl estradiol (ORTHO-CYCLEN,SPRINTEC,PREVIFEM) 0.25-35 MG-MCG tablet, Take 1 tablet by mouth daily., Disp: 1 Package, Rfl: 11 .  PARoxetine (PAXIL) 10 MG tablet, TAKE 1 TABLET BY MOUTH EVERY DAY **MAY INCREASE TO 2 TABLETS DAILY IN 10 DAYS**, Disp: 60 tablet, Rfl: 5 .  PARoxetine (PAXIL) 10 MG tablet, Take 1 tablet (10 mg total) by mouth daily.,  Disp: 90 tablet, Rfl: 3 .  Acetaminophen-Caff-Pyrilamine (MIDOL COMPLETE PO), Take 1 tablet by mouth every 8 (eight) hours as needed (pain)., Disp: , Rfl:  .  ALPRAZolam (XANAX) 0.5 MG tablet, 1-1.5  at onset panic attack (Patient not taking: Reported on 08/12/2015), Disp: 30 tablet, Rfl: 2 .  dicyclomine (BENTYL) 20 MG tablet, Take 1 tablet (20 mg total) by mouth 3 (three) times daily before meals. (Patient not taking: Reported on 08/12/2015), Disp: 30 tablet, Rfl: 1    Review of Systems  Constitutional: Negative for fever and chills.  Respiratory: Negative for chest tightness.   Gastrointestinal: Negative for nausea, vomiting, abdominal pain, diarrhea and constipation.  Musculoskeletal: Positive for myalgias and back pain (lower). Negative for arthralgias and gait problem.  Skin: Negative for rash and wound.       Objective:   Physical Exam  Constitutional: She is oriented to person, place, and time. She appears well-developed and well-nourished. No distress.  HENT:  Head: Normocephalic and atraumatic.  Eyes: EOM are normal. Pupils are equal, round, and reactive to light.  Neck: Neck supple.  Cardiovascular: Normal rate.   Pulmonary/Chest: Effort normal. No respiratory distress.  Musculoskeletal: Normal range of motion.  Tender over lumbar area generally, pain increases with ROM, positive straight leg raise bilaterally 90 degrees, pain in lumbar but no radicular pain, deep tendon reflexes preserved 3+, no sensory or motor losses in extremities.  Neurological: She is alert and oriented to person, place, and time.  Skin: Skin is warm and dry.  Psychiatric: She has a normal mood and affect. Her behavior is normal.  Nursing note and vitals reviewed.   BP 110/64 mmHg  Pulse 68  Temp(Src) 97.6 F (36.4 C) (Oral)  Resp 18  Ht 5' 5.5" (1.664 m)  Wt 125 lb 9.6 oz (56.972 kg)  BMI 20.58 kg/m2  SpO2 98%  LMP 07/24/2015     Assessment & Plan:  Lumbar strain, initial  encounter  Generalized anxiety disorder  Meds ordered this encounter  Medications  . meloxicam (MOBIC) 15 MG tablet    Sig: Take 1 tablet (15 mg total) by mouth daily.    Dispense:  30 tablet    Refill:  2  . traMADol (ULTRAM) 50 MG tablet    Sig: Take 1-2 tablets (50-100 mg total) by mouth at bedtime.    Dispense:  20 tablet    Refill:  0  . tiZANidine (ZANAFLEX) 4 MG tablet    Sig: Take 1 tablet (4 mg total) by mouth at bedtime.    Dispense:  30 tablet    Refill:  2   Exercises bid Limit to 10lbs Brace at work Fu 30 d Pt if not resp   By signing my name below, I, Stann Oresung-Kai Tsai, attest that this documentation has been prepared under the direction and in the presence of Ellamae Siaobert Gracia Saggese, MD. Electronically Signed: Stann Oresung-Kai Tsai, Scribe. 08/12/2015 , 10:09 AM .  I have completed the patient encounter in its entirety as documented by the scribe, with editing by me where necessary. Ahmere Hemenway P. Merla Richesoolittle, M.D.

## 2015-08-12 NOTE — Patient Instructions (Signed)
Low Back Sprain With Rehab A sprain is an injury in which a ligament is torn. The ligaments of the lower back are vulnerable to sprains. However, they are strong and require great force to be injured. These ligaments are important for stabilizing the spinal column. Sprains are classified into three categories. Grade 1 sprains cause pain, but the tendon is not lengthened. Grade 2 sprains include a lengthened ligament, due to the ligament being stretched or partially ruptured. With grade 2 sprains there is still function, although the function may be decreased. Grade 3 sprains involve a complete tear of the tendon or muscle, and function is usually impaired. SYMPTOMS   Severe pain in the lower back.  Sometimes, a feeling of a "pop," "snap," or tear, at the time of injury.  Tenderness and sometimes swelling at the injury site.  Uncommonly, bruising (contusion) within 48 hours of injury.  Muscle spasms in the back. CAUSES  Low back sprains occur when a force is placed on the ligaments that is greater than they can handle. Common causes of injury include:  Performing a stressful act while off-balance.  Repetitive stressful activities that involve movement of the lower back.  Direct hit (trauma) to the lower back. RISK INCREASES WITH:  Contact sports (football, wrestling).  Collisions (major skiing accidents).  Sports that require throwing or lifting (baseball, weightlifting).  Sports involving twisting of the spine (gymnastics, diving, tennis, golf).  Poor strength and flexibility.  Inadequate protection.  Previous back injury or surgery (especially fusion). PREVENTION  Wear properly fitted and padded protective equipment.  Warm up and stretch properly before activity.  Allow for adequate recovery between workouts.  Maintain physical fitness:  Strength, flexibility, and endurance.  Cardiovascular fitness.  Maintain a healthy body weight. PROGNOSIS  If treated properly,  low back sprains usually heal with non-surgical treatment. The length of time for healing depends on the severity of the injury.  RELATED COMPLICATIONS   Recurring symptoms, resulting in a chronic problem.  Chronic inflammation and pain in the low back.  Delayed healing or resolution of symptoms, especially if activity is resumed too soon.  Prolonged impairment.  Unstable or arthritic joints of the low back. TREATMENT  Treatment first involves the use of ice and medicine, to reduce pain and inflammation. The use of strengthening and stretching exercises may help reduce pain with activity. These exercises may be performed at home or with a therapist. Severe injuries may require referral to a therapist for further evaluation and treatment, such as ultrasound. Your caregiver may advise that you wear a back brace or corset, to help reduce pain and discomfort. Often, prolonged bed rest results in greater harm then benefit. Corticosteroid injections may be recommended. However, these should be reserved for the most serious cases. It is important to avoid using your back when lifting objects. At night, sleep on your back on a firm mattress, with a pillow placed under your knees. If non-surgical treatment is unsuccessful, surgery may be needed.  MEDICATION   If pain medicine is needed, nonsteroidal anti-inflammatory medicines (aspirin and ibuprofen), or other minor pain relievers (acetaminophen), are often advised.  Do not take pain medicine for 7 days before surgery.  Prescription pain relievers may be given, if your caregiver thinks they are needed. Use only as directed and only as much as you need.  Ointments applied to the skin may be helpful.  Corticosteroid injections may be given by your caregiver. These injections should be reserved for the most serious cases, because   they may only be given a certain number of times. HEAT AND COLD  Cold treatment (icing) should be applied for 10 to 15  minutes every 2 to 3 hours for inflammation and pain, and immediately after activity that aggravates your symptoms. Use ice packs or an ice massage.  Heat treatment may be used before performing stretching and strengthening activities prescribed by your caregiver, physical therapist, or athletic trainer. Use a heat pack or a warm water soak. SEEK MEDICAL CARE IF:   Symptoms get worse or do not improve in 2 to 4 weeks, despite treatment.  You develop numbness or weakness in either leg.  You lose bowel or bladder function.  Any of the following occur after surgery: fever, increased pain, swelling, redness, drainage of fluids, or bleeding in the affected area.  New, unexplained symptoms develop. (Drugs used in treatment may produce side effects.) EXERCISES  RANGE OF MOTION (ROM) AND STRETCHING EXERCISES - Low Back Sprain Most people with lower back pain will find that their symptoms get worse with excessive bending forward (flexion) or arching at the lower back (extension). The exercises that will help resolve your symptoms will focus on the opposite motion.  Your physician, physical therapist or athletic trainer will help you determine which exercises will be most helpful to resolve your lower back pain. Do not complete any exercises without first consulting with your caregiver. Discontinue any exercises which make your symptoms worse, until you speak to your caregiver. If you have pain, numbness or tingling which travels down into your buttocks, leg or foot, the goal of the therapy is for these symptoms to move closer to your back and eventually resolve. Sometimes, these leg symptoms will get better, but your lower back pain may worsen. This is often an indication of progress in your rehabilitation. Be very alert to any changes in your symptoms and the activities in which you participated in the 24 hours prior to the change. Sharing this information with your caregiver will allow him or her to most  efficiently treat your condition. These exercises may help you when beginning to rehabilitate your injury. Your symptoms may resolve with or without further involvement from your physician, physical therapist or athletic trainer. While completing these exercises, remember:   Restoring tissue flexibility helps normal motion to return to the joints. This allows healthier, less painful movement and activity.  An effective stretch should be held for at least 30 seconds.  A stretch should never be painful. You should only feel a gentle lengthening or release in the stretched tissue. FLEXION RANGE OF MOTION AND STRETCHING EXERCISES: STRETCH - Flexion, Single Knee to Chest   Lie on a firm bed or floor with both legs extended in front of you.  Keeping one leg in contact with the floor, bring your opposite knee to your chest. Hold your leg in place by either grabbing behind your thigh or at your knee.  Pull until you feel a gentle stretch in your low back. Hold __________ seconds.  Slowly release your grasp and repeat the exercise with the opposite side. Repeat __________ times. Complete this exercise __________ times per day.  STRETCH - Flexion, Double Knee to Chest  Lie on a firm bed or floor with both legs extended in front of you.  Keeping one leg in contact with the floor, bring your opposite knee to your chest.  Tense your stomach muscles to support your back and then lift your other knee to your chest. Hold your legs in   place by either grabbing behind your thighs or at your knees.  Pull both knees toward your chest until you feel a gentle stretch in your low back. Hold __________ seconds.  Tense your stomach muscles and slowly return one leg at a time to the floor. Repeat __________ times. Complete this exercise __________ times per day.  STRETCH - Low Trunk Rotation  Lie on a firm bed or floor. Keeping your legs in front of you, bend your knees so they are both pointed toward the  ceiling and your feet are flat on the floor.  Extend your arms out to the side. This will stabilize your upper body by keeping your shoulders in contact with the floor.  Gently and slowly drop both knees together to one side until you feel a gentle stretch in your low back. Hold for __________ seconds.  Tense your stomach muscles to support your lower back as you bring your knees back to the starting position. Repeat the exercise to the other side. Repeat __________ times. Complete this exercise __________ times per day  EXTENSION RANGE OF MOTION AND FLEXIBILITY EXERCISES: STRETCH - Extension, Prone on Elbows   Lie on your stomach on the floor, a bed will be too soft. Place your palms about shoulder width apart and at the height of your head.  Place your elbows under your shoulders. If this is too painful, stack pillows under your chest.  Allow your body to relax so that your hips drop lower and make contact more completely with the floor.  Hold this position for __________ seconds.  Slowly return to lying flat on the floor. Repeat __________ times. Complete this exercise __________ times per day.  RANGE OF MOTION - Extension, Prone Press Ups  Lie on your stomach on the floor, a bed will be too soft. Place your palms about shoulder width apart and at the height of your head.  Keeping your back as relaxed as possible, slowly straighten your elbows while keeping your hips on the floor. You may adjust the placement of your hands to maximize your comfort. As you gain motion, your hands will come more underneath your shoulders.  Hold this position __________ seconds.  Slowly return to lying flat on the floor. Repeat __________ times. Complete this exercise __________ times per day.  RANGE OF MOTION- Quadruped, Neutral Spine   Assume a hands and knees position on a firm surface. Keep your hands under your shoulders and your knees under your hips. You may place padding under your knees for  comfort.  Drop your head and point your tailbone toward the ground below you. This will round out your lower back like an angry cat. Hold this position for __________ seconds.  Slowly lift your head and release your tail bone so that your back sags into a large arch, like an old horse.  Hold this position for __________ seconds.  Repeat this until you feel limber in your low back.  Now, find your "sweet spot." This will be the most comfortable position somewhere between the two previous positions. This is your neutral spine. Once you have found this position, tense your stomach muscles to support your low back.  Hold this position for __________ seconds. Repeat __________ times. Complete this exercise __________ times per day.  STRENGTHENING EXERCISES - Low Back Sprain These exercises may help you when beginning to rehabilitate your injury. These exercises should be done near your "sweet spot." This is the neutral, low-back arch, somewhere between fully rounded and   fully arched, that is your least painful position. When performed in this safe range of motion, these exercises can be used for people who have either a flexion or extension based injury. These exercises may resolve your symptoms with or without further involvement from your physician, physical therapist or athletic trainer. While completing these exercises, remember:   Muscles can gain both the endurance and the strength needed for everyday activities through controlled exercises.  Complete these exercises as instructed by your physician, physical therapist or athletic trainer. Increase the resistance and repetitions only as guided.  You may experience muscle soreness or fatigue, but the pain or discomfort you are trying to eliminate should never worsen during these exercises. If this pain does worsen, stop and make certain you are following the directions exactly. If the pain is still present after adjustments, discontinue the  exercise until you can discuss the trouble with your caregiver. STRENGTHENING - Deep Abdominals, Pelvic Tilt   Lie on a firm bed or floor. Keeping your legs in front of you, bend your knees so they are both pointed toward the ceiling and your feet are flat on the floor.  Tense your lower abdominal muscles to press your low back into the floor. This motion will rotate your pelvis so that your tail bone is scooping upwards rather than pointing at your feet or into the floor. With a gentle tension and even breathing, hold this position for __________ seconds. Repeat __________ times. Complete this exercise __________ times per day.  STRENGTHENING - Abdominals, Crunches   Lie on a firm bed or floor. Keeping your legs in front of you, bend your knees so they are both pointed toward the ceiling and your feet are flat on the floor. Cross your arms over your chest.  Slightly tip your chin down without bending your neck.  Tense your abdominals and slowly lift your trunk high enough to just clear your shoulder blades. Lifting higher can put excessive stress on the lower back and does not further strengthen your abdominal muscles.  Control your return to the starting position. Repeat __________ times. Complete this exercise __________ times per day.  STRENGTHENING - Quadruped, Opposite UE/LE Lift   Assume a hands and knees position on a firm surface. Keep your hands under your shoulders and your knees under your hips. You may place padding under your knees for comfort.  Find your neutral spine and gently tense your abdominal muscles so that you can maintain this position. Your shoulders and hips should form a rectangle that is parallel with the floor and is not twisted.  Keeping your trunk steady, lift your right hand no higher than your shoulder and then your left leg no higher than your hip. Make sure you are not holding your breath. Hold this position for __________ seconds.  Continuing to keep  your abdominal muscles tense and your back steady, slowly return to your starting position. Repeat with the opposite arm and leg. Repeat __________ times. Complete this exercise __________ times per day.  STRENGTHENING - Abdominals and Quadriceps, Straight Leg Raise   Lie on a firm bed or floor with both legs extended in front of you.  Keeping one leg in contact with the floor, bend the other knee so that your foot can rest flat on the floor.  Find your neutral spine, and tense your abdominal muscles to maintain your spinal position throughout the exercise.  Slowly lift your straight leg off the floor about 6 inches for a count of   15, making sure to not hold your breath.  Still keeping your neutral spine, slowly lower your leg all the way to the floor. Repeat this exercise with each leg __________ times. Complete this exercise __________ times per day. POSTURE AND BODY MECHANICS CONSIDERATIONS - Low Back Sprain Keeping correct posture when sitting, standing or completing your activities will reduce the stress put on different body tissues, allowing injured tissues a chance to heal and limiting painful experiences. The following are general guidelines for improved posture. Your physician or physical therapist will provide you with any instructions specific to your needs. While reading these guidelines, remember:  The exercises prescribed by your provider will help you have the flexibility and strength to maintain correct postures.  The correct posture provides the best environment for your joints to work. All of your joints have less wear and tear when properly supported by a spine with good posture. This means you will experience a healthier, less painful body.  Correct posture must be practiced with all of your activities, especially prolonged sitting and standing. Correct posture is as important when doing repetitive low-stress activities (typing) as it is when doing a single heavy-load  activity (lifting). RESTING POSITIONS Consider which positions are most painful for you when choosing a resting position. If you have pain with flexion-based activities (sitting, bending, stooping, squatting), choose a position that allows you to rest in a less flexed posture. You would want to avoid curling into a fetal position on your side. If your pain worsens with extension-based activities (prolonged standing, working overhead), avoid resting in an extended position such as sleeping on your stomach. Most people will find more comfort when they rest with their spine in a more neutral position, neither too rounded nor too arched. Lying on a non-sagging bed on your side with a pillow between your knees, or on your back with a pillow under your knees will often provide some relief. Keep in mind, being in any one position for a prolonged period of time, no matter how correct your posture, can still lead to stiffness. PROPER SITTING POSTURE In order to minimize stress and discomfort on your spine, you must sit with correct posture. Sitting with good posture should be effortless for a healthy body. Returning to good posture is a gradual process. Many people can work toward this most comfortably by using various supports until they have the flexibility and strength to maintain this posture on their own. When sitting with proper posture, your ears will fall over your shoulders and your shoulders will fall over your hips. You should use the back of the chair to support your upper back. Your lower back will be in a neutral position, just slightly arched. You may place a small pillow or folded towel at the base of your lower back for  support.  When working at a desk, create an environment that supports good, upright posture. Without extra support, muscles tire, which leads to excessive strain on joints and other tissues. Keep these recommendations in mind: CHAIR:  A chair should be able to slide under your desk  when your back makes contact with the back of the chair. This allows you to work closely.  The chair's height should allow your eyes to be level with the upper part of your monitor and your hands to be slightly lower than your elbows. BODY POSITION  Your feet should make contact with the floor. If this is not possible, use a foot rest.  Keep your ears   over your shoulders. This will reduce stress on your neck and low back. INCORRECT SITTING POSTURES  If you are feeling tired and unable to assume a healthy sitting posture, do not slouch or slump. This puts excessive strain on your back tissues, causing more damage and pain. Healthier options include:  Using more support, like a lumbar pillow.  Switching tasks to something that requires you to be upright or walking.  Talking a brief walk.  Lying down to rest in a neutral-spine position. PROLONGED STANDING WHILE SLIGHTLY LEANING FORWARD  When completing a task that requires you to lean forward while standing in one place for a long time, place either foot up on a stationary 2-4 inch high object to help maintain the best posture. When both feet are on the ground, the lower back tends to lose its slight inward curve. If this curve flattens (or becomes too large), then the back and your other joints will experience too much stress, tire more quickly, and can cause pain. CORRECT STANDING POSTURES Proper standing posture should be assumed with all daily activities, even if they only take a few moments, like when brushing your teeth. As in sitting, your ears should fall over your shoulders and your shoulders should fall over your hips. You should keep a slight tension in your abdominal muscles to brace your spine. Your tailbone should point down to the ground, not behind your body, resulting in an over-extended swayback posture.  INCORRECT STANDING POSTURES  Common incorrect standing postures include a forward head, locked knees and/or an excessive  swayback. WALKING Walk with an upright posture. Your ears, shoulders and hips should all line-up. PROLONGED ACTIVITY IN A FLEXED POSITION When completing a task that requires you to bend forward at your waist or lean over a low surface, try to find a way to stabilize 3 out of 4 of your limbs. You can place a hand or elbow on your thigh or rest a knee on the surface you are reaching across. This will provide you more stability, so that your muscles do not tire as quickly. By keeping your knees relaxed, or slightly bent, you will also reduce stress across your lower back. CORRECT LIFTING TECHNIQUES DO :  Assume a wide stance. This will provide you more stability and the opportunity to get as close as possible to the object which you are lifting.  Tense your abdominals to brace your spine. Bend at the knees and hips. Keeping your back locked in a neutral-spine position, lift using your leg muscles. Lift with your legs, keeping your back straight.  Test the weight of unknown objects before attempting to lift them.  Try to keep your elbows locked down at your sides in order get the best strength from your shoulders when carrying an object.  Always ask for help when lifting heavy or awkward objects. INCORRECT LIFTING TECHNIQUES DO NOT:   Lock your knees when lifting, even if it is a small object.  Bend and twist. Pivot at your feet or move your feet when needing to change directions.  Assume that you can safely pick up even a paperclip without proper posture.   This information is not intended to replace advice given to you by your health care provider. Make sure you discuss any questions you have with your health care provider.   Document Released: 08/19/2005 Document Revised: 09/09/2014 Document Reviewed: 12/01/2008 [employer] Interactive Patient Education 2016 [employer]  

## 2015-08-18 IMAGING — CR DG NECK SOFT TISSUE
1 series · 1 of 1 positions shown · non-contrast
Comparison: None.

CLINICAL DATA: Difficulty swallowing.

EXAM:
NECK SOFT TISSUES - 1+ VIEW

[w soft tissue neck lat]
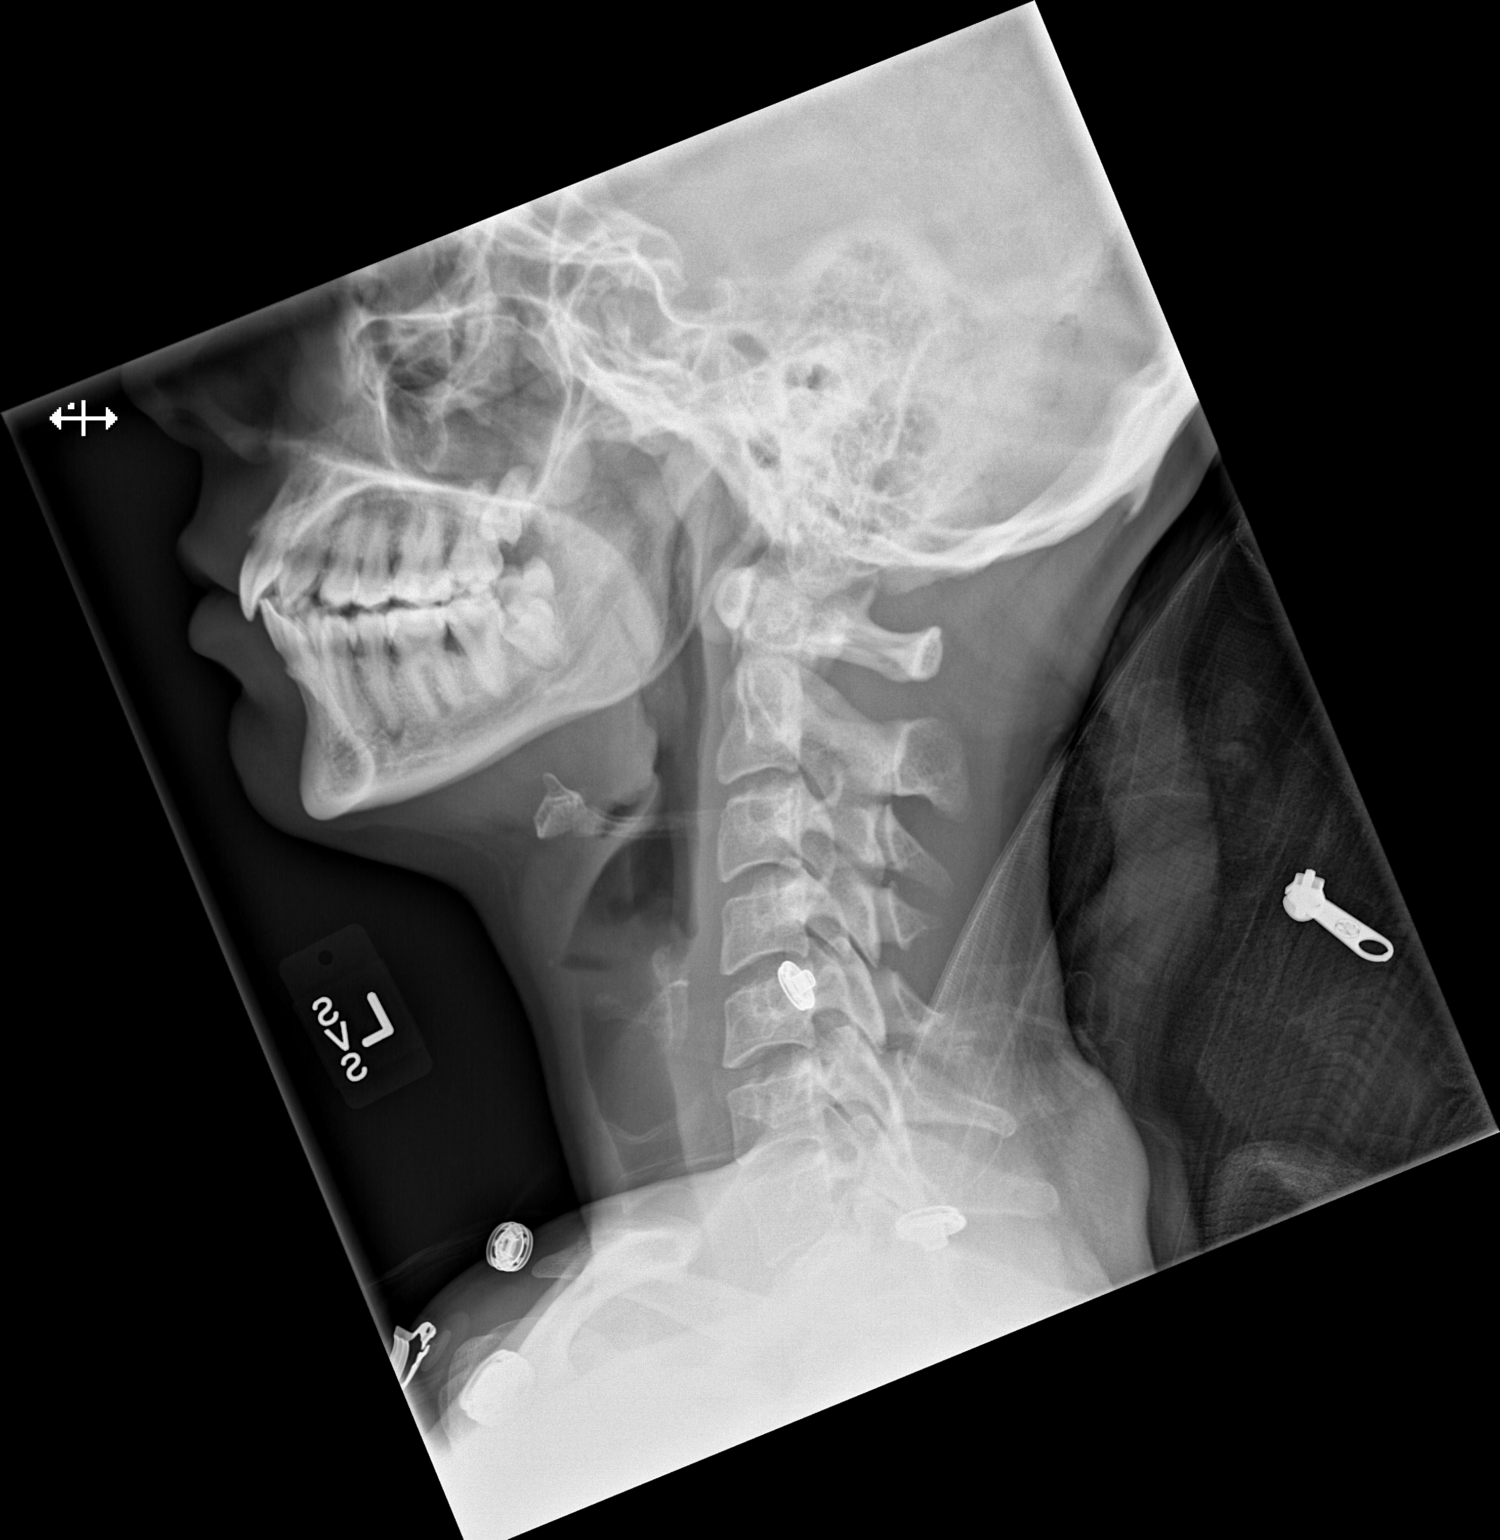

[1 of 1 positions shown; findings below may reference images not displayed]

FINDINGS: There is no evidence of retropharyngeal soft tissue swelling or
epiglottic enlargement. The cervical airway is unremarkable and no
radio-opaque foreign body identified.
IMPRESSION: Negative exam.

## 2015-08-26 ENCOUNTER — Other Ambulatory Visit: Payer: Self-pay | Admitting: Internal Medicine

## 2015-09-09 ENCOUNTER — Other Ambulatory Visit: Payer: Self-pay | Admitting: Internal Medicine

## 2015-09-11 NOTE — Telephone Encounter (Signed)
Ok but if back pain not resolved over next month we need to reevaluate  Meds ordered this encounter  Medications  . traMADol (ULTRAM) 50 MG tablet    Sig: TAKE 1 OR 2 TABLETS BY MOUTH AT BEDTIME    Dispense:  20 tablet    Refill:  0

## 2015-09-12 NOTE — Telephone Encounter (Signed)
Notified pt on VM of RF and need for f/up is Sxs don't resolve x 1 mos.

## 2015-09-19 ENCOUNTER — Ambulatory Visit (INDEPENDENT_AMBULATORY_CARE_PROVIDER_SITE_OTHER): Payer: 59 | Admitting: Urgent Care

## 2015-09-19 VITALS — BP 118/72 | HR 91 | Temp 98.8°F | Resp 18 | Ht 65.5 in | Wt 125.0 lb

## 2015-09-19 DIAGNOSIS — Z309 Encounter for contraceptive management, unspecified: Secondary | ICD-10-CM | POA: Diagnosis not present

## 2015-09-19 DIAGNOSIS — R059 Cough, unspecified: Secondary | ICD-10-CM

## 2015-09-19 DIAGNOSIS — R0981 Nasal congestion: Secondary | ICD-10-CM

## 2015-09-19 DIAGNOSIS — N926 Irregular menstruation, unspecified: Secondary | ICD-10-CM

## 2015-09-19 DIAGNOSIS — J069 Acute upper respiratory infection, unspecified: Secondary | ICD-10-CM

## 2015-09-19 DIAGNOSIS — K30 Functional dyspepsia: Secondary | ICD-10-CM | POA: Diagnosis not present

## 2015-09-19 DIAGNOSIS — R05 Cough: Secondary | ICD-10-CM

## 2015-09-19 DIAGNOSIS — R109 Unspecified abdominal pain: Secondary | ICD-10-CM

## 2015-09-19 LAB — COMPREHENSIVE METABOLIC PANEL
ALT: 110 U/L — ABNORMAL HIGH (ref 6–29)
AST: 66 U/L — ABNORMAL HIGH (ref 10–30)
Albumin: 4.5 g/dL (ref 3.6–5.1)
Alkaline Phosphatase: 55 U/L (ref 33–115)
BUN: 9 mg/dL (ref 7–25)
CHLORIDE: 103 mmol/L (ref 98–110)
CO2: 25 mmol/L (ref 20–31)
CREATININE: 0.56 mg/dL (ref 0.50–1.10)
Calcium: 9 mg/dL (ref 8.6–10.2)
GLUCOSE: 80 mg/dL (ref 65–99)
Potassium: 3.8 mmol/L (ref 3.5–5.3)
SODIUM: 138 mmol/L (ref 135–146)
Total Bilirubin: 0.3 mg/dL (ref 0.2–1.2)
Total Protein: 7.8 g/dL (ref 6.1–8.1)

## 2015-09-19 LAB — POCT CBC
Granulocyte percent: 37.7 %G (ref 37–80)
HCT, POC: 40.4 % (ref 37.7–47.9)
HEMOGLOBIN: 13.4 g/dL (ref 12.2–16.2)
LYMPH, POC: 2.1 (ref 0.6–3.4)
MCH, POC: 30.1 pg (ref 27–31.2)
MCHC: 33.2 g/dL (ref 31.8–35.4)
MCV: 90.7 fL (ref 80–97)
MID (cbc): 0.6 (ref 0–0.9)
MPV: 7.6 fL (ref 0–99.8)
PLATELET COUNT, POC: 220 10*3/uL (ref 142–424)
POC GRANULOCYTE: 1.7 — AB (ref 2–6.9)
POC LYMPH PERCENT: 48.5 %L (ref 10–50)
POC MID %: 13.8 %M — AB (ref 0–12)
RBC: 4.46 M/uL (ref 4.04–5.48)
RDW, POC: 12 %
WBC: 4.4 10*3/uL — AB (ref 4.6–10.2)

## 2015-09-19 MED ORDER — BENZONATATE 100 MG PO CAPS: 100.0000 mg | ORAL_CAPSULE | Freq: Three times a day (TID) | ORAL | Status: DC | PRN

## 2015-09-19 MED ORDER — HYDROCODONE-HOMATROPINE 5-1.5 MG/5ML PO SYRP: 5.0000 mL | ORAL_SOLUTION | Freq: Every evening | ORAL | Status: DC | PRN

## 2015-09-19 MED ORDER — NORETHIN ACE-ETH ESTRAD-FE 1.5-30 MG-MCG PO TABS: 1.0000 | ORAL_TABLET | Freq: Every day | ORAL | Status: AC

## 2015-09-19 NOTE — Progress Notes (Signed)
MRN: [id number] DOB: 1986/08/03  Subjective:   Molly Allen is a 30 y.o. female presenting for chief complaint of Abdominal Pain  Reports 4 month history of intermittent belly pain. Pain is like a stomach ache, feels very crampy associated with nausea. Patient has tried Midol and ibuprofen with minimal relief. Patient has made dietary modifications. Patient started Sprintec to help regulate her cycles. However, it has not worked and she is worried that it is the source of her belly pain since it is very closely associated with the start of her symptoms. Patient has 1 sister with diagnosis of PCOS and would like to know if she has it as well.  Reports 3 day history of productive cough, worse at night, sinus congestion, sinus pain. Patient has had 2 sick contacts. She denies fever, chest pain, shob, ear pain, ear drainage, sore throat.   Molly Allen has a current medication list which includes the following prescription(s): acetaminophen-caff-pyrilamine, meloxicam, methocarbamol, multivitamin with minerals, norgestimate-ethinyl estradiol, paroxetine, tizanidine, and tramadol. Also has No Known Allergies.  Molly Allen  has a past medical history of Anxiety. Also  has no past surgical history on file.  Objective:   Vitals: BP 118/72 mmHg  Pulse 91  Temp(Src) 98.8 F (37.1 C) (Oral)  Resp 18  Ht 5' 5.5" (1.664 m)  Wt 125 lb (56.7 kg)  BMI 20.48 kg/m2  SpO2 97%  LMP 08/25/2015  Physical Exam  Constitutional: She is oriented to person, place, and time. She appears well-developed and well-nourished.  HENT:  TM's intact bilaterally, no effusions or erythema. Left nasal turbinates erythematous and edematous, nasal passages on left minimally patent. No sinus tenderness. Oropharynx without exudates, tonsillar erythema.  Eyes: Right eye exhibits no discharge. Left eye exhibits no discharge. No scleral icterus.  Neck: Normal range of motion. Neck supple.  Cardiovascular: Normal rate, regular  rhythm and intact distal pulses.  Exam reveals no gallop and no friction rub.   No murmur heard. Pulmonary/Chest: No respiratory distress. She has no wheezes. She has no rales.  Abdominal: Soft. Bowel sounds are normal. She exhibits no distension and no mass. There is no tenderness.  Lymphadenopathy:    She has no cervical adenopathy.  Neurological: She is alert and oriented to person, place, and time.  Skin: Skin is warm and dry.   Results for orders placed or performed in visit on 09/19/15 (from the past 24 hour(s))  POCT CBC     Status: Abnormal   Collection Time: 09/19/15  4:10 PM  Result Value Ref Range   WBC 4.4 (A) 4.6 - 10.2 K/uL   Lymph, poc 2.1 0.6 - 3.4   POC LYMPH PERCENT 48.5 10 - 50 %L   MID (cbc) 0.6 0 - 0.9   POC MID % 13.8 (A) 0 - 12 %M   POC Granulocyte 1.7 (A) 2 - 6.9   Granulocyte percent 37.7 37 - 80 %G   RBC 4.46 4.04 - 5.48 M/uL   Hemoglobin 13.4 12.2 - 16.2 g/dL   HCT, POC 84.1 32.4 - 47.9 %   MCV 90.7 80 - 97 fL   MCH, POC 30.1 27 - 31.2 pg   MCHC 33.2 31.8 - 35.4 g/dL   RDW, POC 40.1 %   Platelet Count, POC 220 142 - 424 K/uL   MPV 7.6 0 - 99.8 fL   Assessment and Plan :   1. Encounter for contraceptive management, unspecified encounter 2. Irregular periods/menstrual cycles 3. Belly pain - Counseled on contraception,  PCOS. Patient will try Junel. She declined pelvic US, states that her pap smears are up to date and have been normal. She will f/u and let me know if she wants to pursue the pelvic US.  4. Acute upper respiratory infection 5. Cough 6. Sinus congestion - Likely undergoing viral syndrome, start supportive care. Hycodan and Tessalon for cough, NSAID for sinus pain, oral antihistamine for congestion.  Wallis Bamberg, PA-C [facility] [phone] 09/19/2015 3:27 PM

## 2015-09-19 NOTE — Patient Instructions (Addendum)
Ethinyl Estradiol; Norethindrone Acetate tablets (contraception) What is this medicine? ETHINYL ESTRADIOL; NORETHINDRONE ACETATE (ETH in il es tra DYE ole; nor eth IN drone AS e tate) is an oral contraceptive. The products combine two types of female hormones, an estrogen and a progestin. They are used to prevent ovulation and pregnancy. This medicine may be used for other purposes; ask your health care provider or pharmacist if you have questions. What should I tell my health care provider before I take this medicine? They need to know if you have or ever had any of these conditions: -abnormal vaginal bleeding -blood vessel disease or blood clots -breast, cervical, endometrial, ovarian, liver, or uterine cancer -diabetes -gallbladder disease -heart disease or recent heart attack -high blood pressure -high cholesterol -kidney disease -liver disease -migraine headaches -stroke -systemic lupus erythematosus (SLE) -tobacco smoker -an unusual or allergic reaction to estrogens, progestins, other medicines, foods, dyes, or preservatives -pregnant or trying to get pregnant -breast-feeding How should I use this medicine? Take this medicine by mouth. To reduce nausea, this medicine may be taken with food. Follow the directions on the prescription label. Take this medicine at the same time each day and in the order directed on the package. Do not take your medicine more often than directed. Contact your pediatrician regarding the use of this medicine in children. Special care may be needed. This medicine has been used in female children who have started having menstrual periods. A patient package insert for the product will be given with each prescription and refill. Read this sheet carefully each time. The sheet may change frequently. Overdosage: If you think you have taken too much of this medicine contact a poison control center or emergency room at once. NOTE: This medicine is only for you. Do  not share this medicine with others. What if I miss a dose? If you miss a dose, refer to the patient information sheet you received with your medicine for direction. If you miss more than one pill, this medicine may not be as effective and you may need to use another form of birth control. What may interact with this medicine? -acetaminophen -antibiotics or medicines for infections, especially rifampin, rifabutin, rifapentine, and griseofulvin, and possibly penicillins or tetracyclines -aprepitant -ascorbic acid (vitamin C) -atorvastatin -barbiturate medicines, such as phenobarbital -bosentan -carbamazepine -caffeine -clofibrate -cyclosporine -dantrolene -doxercalciferol -felbamate -grapefruit juice -hydrocortisone -medicines for anxiety or sleeping problems, such as diazepam or temazepam -medicines for diabetes, including pioglitazone -mineral oil -modafinil -mycophenolate -nefazodone -oxcarbazepine -phenytoin -prednisolone -ritonavir or other medicines for HIV infection or AIDS -rosuvastatin -selegiline -soy isoflavones supplements -St. John's wort -tamoxifen or raloxifene -theophylline -thyroid hormones -topiramate -warfarin This list may not describe all possible interactions. Give your health care provider a list of all the medicines, herbs, non-prescription drugs, or dietary supplements you use. Also tell them if you smoke, drink alcohol, or use illegal drugs. Some items may interact with your medicine. What should I watch for while using this medicine? Visit your doctor or health care professional for regular checks on your progress. You will need a regular breast and pelvic exam and Pap smear while on this medicine. Use an additional method of contraception during the first cycle that you take these tablets. If you have any reason to think you are pregnant, stop taking this medicine right away and contact your doctor or health care professional. If you are taking  this medicine for hormone related problems, it may take several cycles of use to see improvement in your   condition. Smoking increases the risk of getting a blood clot or having a stroke while you are taking birth control pills, especially if you are more than 29 years old. You are strongly advised not to smoke. This medicine can make your body retain fluid, making your fingers, hands, or ankles swell. Your blood pressure can go up. Contact your doctor or health care professional if you feel you are retaining fluid. This medicine can make you more sensitive to the sun. Keep out of the sun. If you cannot avoid being in the sun, wear protective clothing and use sunscreen. Do not use sun lamps or tanning beds/booths. If you wear contact lenses and notice visual changes, or if the lenses begin to feel uncomfortable, consult your eye care specialist. In some women, tenderness, swelling, or minor bleeding of the gums may occur. Notify your dentist if this happens. Brushing and flossing your teeth regularly may help limit this. See your dentist regularly and inform your dentist of the medicines you are taking. If you are going to have elective surgery, you may need to stop taking this medicine before the surgery. Consult your health care professional for advice. This medicine does not protect you against HIV infection (AIDS) or any other sexually transmitted diseases. What side effects may I notice from receiving this medicine? Side effects that you should report to your doctor or health care professional as soon as possible: -breast tissue changes or discharge -changes in vaginal bleeding during your period or between your periods -chest pain -coughing up blood -dizziness or fainting spells -headaches or migraines -leg, arm or groin pain -severe or sudden headaches -stomach pain (severe) -sudden shortness of breath -sudden loss of coordination, especially on one side of the body -speech  problems -symptoms of vaginal infection like itching, irritation or unusual discharge -tenderness in the upper abdomen -vomiting -weakness or numbness in the arms or legs, especially on one side of the body -yellowing of the eyes or skin Side effects that usually do not require medical attention (report to your doctor or health care professional if they continue or are bothersome): -breakthrough bleeding and spotting that continues beyond the 3 initial cycles of pills -breast tenderness -mood changes, anxiety, depression, frustration, anger, or emotional outbursts -increased sensitivity to sun or ultraviolet light -nausea -skin rash, acne, or brown spots on the skin -weight gain (slight) This list may not describe all possible side effects. Call your doctor for medical advice about side effects. You may report side effects to FDA at 1-800-FDA-1088. Where should I keep my medicine? Keep out of the reach of children. Store at room temperature between 15 and 30 degrees C (59 and 86 degrees F). Throw away any unused medicine after the expiration date. NOTE: This sheet is a summary. It may not cover all possible information. If you have questions about this medicine, talk to your doctor, pharmacist, or health care provider.    2016, Elsevier/Gold Standard. (2012-12-25 15:35:20)    Try Zyrtec and pseudoephedrine to help you with your nasal congestion.  Upper Respiratory Infection, Adult Most upper respiratory infections (URIs) are a viral infection of the air passages leading to the lungs. A URI affects the nose, throat, and upper air passages. The most common type of URI is nasopharyngitis and is typically referred to as "the common cold." URIs run their course and usually go away on their own. Most of the time, a URI does not require medical attention, but sometimes a bacterial infection in the upper airways  can follow a viral infection. This is called a secondary infection. Sinus and middle  ear infections are common types of secondary upper respiratory infections. Bacterial pneumonia can also complicate a URI. A URI can worsen asthma and chronic obstructive pulmonary disease (COPD). Sometimes, these complications can require emergency medical care and may be life threatening.  CAUSES Almost all URIs are caused by viruses. A virus is a type of germ and can spread from one person to another.  RISKS FACTORS You may be at risk for a URI if:   You smoke.   You have chronic heart or lung disease.  You have a weakened defense (immune) system.   You are very young or very old.   You have nasal allergies or asthma.  You work in crowded or poorly ventilated areas.  You work in health care facilities or schools. SIGNS AND SYMPTOMS  Symptoms typically develop 2-3 days after you come in contact with a cold virus. Most viral URIs last 7-10 days. However, viral URIs from the influenza virus (flu virus) can last 14-18 days and are typically more severe. Symptoms may include:   Runny or stuffy (congested) nose.   Sneezing.   Cough.   Sore throat.   Headache.   Fatigue.   Fever.   Loss of appetite.   Pain in your forehead, behind your eyes, and over your cheekbones (sinus pain).  Muscle aches.  DIAGNOSIS  Your health care provider may diagnose a URI by:  Physical exam.  Tests to check that your symptoms are not due to another condition such as:  Strep throat.  Sinusitis.  Pneumonia.  Asthma. TREATMENT  A URI goes away on its own with time. It cannot be cured with medicines, but medicines may be prescribed or recommended to relieve symptoms. Medicines may help:  Reduce your fever.  Reduce your cough.  Relieve nasal congestion. HOME CARE INSTRUCTIONS   Take medicines only as directed by your health care provider.   Gargle warm saltwater or take cough drops to comfort your throat as directed by your health care provider.  Use a warm mist  humidifier or inhale steam from a shower to increase air moisture. This may make it easier to breathe.  Drink enough fluid to keep your urine clear or pale yellow.   Eat soups and other clear broths and maintain good nutrition.   Rest as needed.   Return to work when your temperature has returned to normal or as your health care provider advises. You may need to stay home longer to avoid infecting others. You can also use a face mask and careful hand washing to prevent spread of the virus.  Increase the usage of your inhaler if you have asthma.   Do not use any tobacco products, including cigarettes, chewing tobacco, or electronic cigarettes. If you need help quitting, ask your health care provider. PREVENTION  The best way to protect yourself from getting a cold is to practice good hygiene.   Avoid oral or hand contact with people with cold symptoms.   Wash your hands often if contact occurs.  There is no clear evidence that vitamin C, vitamin E, echinacea, or exercise reduces the chance of developing a cold. However, it is always recommended to get plenty of rest, exercise, and practice good nutrition.  SEEK MEDICAL CARE IF:   You are getting worse rather than better.   Your symptoms are not controlled by medicine.   You have chills.  You have worsening  shortness of breath.  You have brown or red mucus.  You have yellow or brown nasal discharge.  You have pain in your face, especially when you bend forward.  You have a fever.  You have swollen neck glands.  You have pain while swallowing.  You have white areas in the back of your throat. SEEK IMMEDIATE MEDICAL CARE IF:   You have severe or persistent:  Headache.  Ear pain.  Sinus pain.  Chest pain.  You have chronic lung disease and any of the following:  Wheezing.  Prolonged cough.  Coughing up blood.  A change in your usual mucus.  You have a stiff neck.  You have changes in  your:  Vision.  Hearing.  Thinking.  Mood. MAKE SURE YOU:   Understand these instructions.  Will watch your condition.  Will get help right away if you are not doing well or get worse.   This information is not intended to replace advice given to you by your health care provider. Make sure you discuss any questions you have with your health care provider.   Document Released: 02/12/2001 Document Revised: 01/03/2015 Document Reviewed: 11/24/2013 Elsevier Interactive Patient Education [employer]

## 2015-09-20 ENCOUNTER — Telehealth: Payer: Self-pay | Admitting: Urgent Care

## 2015-09-20 NOTE — Telephone Encounter (Signed)
Left VM requesting call back. Reported patient's liver enzymes were elevated. Patient was seen for upper belly pain. I do think that we should pursue more labs and a RUQ U/S but patient is paying out of pocket and want to get her okay for this. Please let me know what she says.

## 2015-09-22 NOTE — Telephone Encounter (Signed)
I provided results in the VM and can also be found in my first documentation. Please read it and report it to patient since she's asking for her results. Otherwise you can mail them to her. It's fine if she does not want to do more testing until she gets insurance.

## 2015-09-22 NOTE — Telephone Encounter (Signed)
Patient called back wanting to know her lab results.  Pt stated she can not pay out of pocket because it is too expensive.  She stated she will like to wait until her insurance kicks in before she has her blood redrawn again.

## 2015-09-22 NOTE — Telephone Encounter (Signed)
Left message for pt to call back  °

## 2015-09-26 NOTE — Telephone Encounter (Signed)
Mailed letter °

## 2015-11-16 ENCOUNTER — Other Ambulatory Visit: Payer: Self-pay | Admitting: Internal Medicine

## 2015-11-18 NOTE — Telephone Encounter (Signed)
Spoke with patient she is going to come in on 3/22 for a follow up feels she needs something stronger than mobic.

## 2015-12-19 ENCOUNTER — Ambulatory Visit (INDEPENDENT_AMBULATORY_CARE_PROVIDER_SITE_OTHER): Payer: 59 | Admitting: Family Medicine

## 2015-12-19 VITALS — BP 122/68 | HR 72 | Temp 98.1°F | Resp 16 | Ht 65.0 in | Wt 128.0 lb

## 2015-12-19 DIAGNOSIS — S39012A Strain of muscle, fascia and tendon of lower back, initial encounter: Secondary | ICD-10-CM | POA: Diagnosis not present

## 2015-12-19 DIAGNOSIS — M545 Low back pain: Secondary | ICD-10-CM | POA: Diagnosis not present

## 2015-12-19 MED ORDER — CYCLOBENZAPRINE HCL 5 MG PO TABS: ORAL_TABLET | ORAL | Status: AC

## 2015-12-19 MED ORDER — DICLOFENAC SODIUM 75 MG PO TBEC: 75.0000 mg | DELAYED_RELEASE_TABLET | Freq: Two times a day (BID) | ORAL | Status: AC

## 2015-12-19 MED ORDER — TRAMADOL HCL 50 MG PO TABS: ORAL_TABLET | ORAL | Status: DC

## 2015-12-19 NOTE — Progress Notes (Signed)
Patient ID: Molly FlockSalina R Allen, female    DOB: 03/16/1986  Age: 30 y.o. MRN: [id number]  Chief Complaint  Patient presents with  . Back Pain    lower, x 2 weeks    Subjective:   Patient has a history of back strain problems in the past. She tried muscle relaxants and anti-inflammatory medicine with verbal help. 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 She does try and do some exercises stretches. She has a physical therapy technician friend who helps her about once a week on her back. She has tried Zanaflex and cyclobenzaprine and Robaxin in the past. Tramadol helps some. She is out of them.  Current allergies, medications, problem list, past/family and social histories reviewed.  Objective:  BP 122/68 mmHg  Pulse 72  Temp(Src) 98.1 F (36.7 C)  Resp 16  Ht 5\' 5"  (1.651 m)  Wt 128 lb (58.06 kg)  BMI 21.30 kg/m2  SpO2 99%  LMP 11/21/2015 (Approximate)  No major acute distress. Tender in the lower lumbar spine and right paraspinous muscles. Pain with anterior flexion but can flex fairly well. Side-to-side tilt also hurts as does trouble rotation. Straight leg raising is negative that she gets back pain at about 90.  Every flexor brisk and symmetrical. Gait a little stiff but normal.  Assessment & Plan:   Assessment: 1. Right low back pain, with sciatica presence unspecified   2. Lumbar spine strain, initial encounter       Plan: Clinical treatment. See instructions  No orders of the defined types were placed in this encounter.    No orders of the defined types were placed in this encounter.         Patient Instructions   Take Flexeril (cyclobenzaprine) 5 mg 1 or 2 pills at bedtime  Take the diclofenac 75 mg one twice daily at breakfast and supper for pain and inflammation  Take the tramadol  1 every 6-8 hours only when needed for severe pain  Back stretches  Avoid lifting more than 5-10 pounds. Use proper lifting  technique  Return if not improving  See work note   IF you received an x-ray today, you will receive an invoice from [facility]. Please contact [facility] at [phone] with questions or concerns regarding your invoice.   IF you received labwork today, you will receive an invoice from [facility]. Please contact [facility] at [phone] with questions or concerns regarding your invoice.   Our billing staff will not be able to assist you with questions regarding bills from these companies.  You will be contacted with the lab results as soon as they are available. The fastest way to get your results is to activate your My Chart account. Instructions are located on the last page of this paperwork. If you have not heard from us regarding the results in 2 weeks, please contact this office.          Return if symptoms worsen or fail to improve.   HOPPER,DAVID, MD 12/19/2015

## 2015-12-19 NOTE — Patient Instructions (Addendum)
Take Flexeril (cyclobenzaprine) 5 mg 1 or 2 pills at bedtime  Take the diclofenac 75 mg one twice daily at breakfast and supper for pain and inflammation  Take the tramadol  1 every 6-8 hours only when needed for severe pain  Back stretches  Avoid lifting more than 5-10 pounds. Use proper lifting technique  Return if not improving  See work note  If taking more than 1-2 tramadol in 24 hours decrease the Paxil to every other day while taking this.   IF you received an x-ray today, you will receive an invoice from [facility]. Please contact [facility] at [phone] with questions or concerns regarding your invoice.   IF you received labwork today, you will receive an invoice from [facility]. Please contact [facility] at [phone] with questions or concerns regarding your invoice.   Our billing staff will not be able to assist you with questions regarding bills from these companies.  You will be contacted with the lab results as soon as they are available. The fastest way to get your results is to activate your My Chart account. Instructions are located on the last page of this paperwork. If you have not heard from us regarding the results in 2 weeks, please contact this office.

## 2016-02-12 ENCOUNTER — Other Ambulatory Visit: Payer: Self-pay | Admitting: Emergency Medicine

## 2016-02-12 ENCOUNTER — Telehealth: Payer: Self-pay

## 2016-02-12 DIAGNOSIS — S39012A Strain of muscle, fascia and tendon of lower back, initial encounter: Secondary | ICD-10-CM

## 2016-02-12 DIAGNOSIS — M545 Low back pain: Secondary | ICD-10-CM

## 2016-02-12 MED ORDER — TRAMADOL HCL 50 MG PO TABS: ORAL_TABLET | ORAL | Status: DC

## 2016-02-12 MED ORDER — TRAMADOL HCL 50 MG PO TABS: ORAL_TABLET | ORAL | Status: AC

## 2016-02-12 NOTE — Telephone Encounter (Signed)
Noreene LarssonJill, it looks like you printed off this medication. Has this been taken care of and pt aware?

## 2016-02-12 NOTE — Telephone Encounter (Signed)
Patient request a refill on Tramadol. Patient stated she requested the refill on Friday. Patient have not heard from anyone concerning the medication. Please call patient at [phone].

## 2016-05-31 ENCOUNTER — Other Ambulatory Visit: Payer: Self-pay

## 2016-05-31 MED ORDER — PAROXETINE HCL 10 MG PO TABS: ORAL_TABLET | ORAL | 0 refills | Status: AC

## 2024-03-19 ENCOUNTER — Encounter: Payer: Self-pay | Admitting: Advanced Practice Midwife
